# Patient Record
Sex: Male | Born: 1955 | Race: White | Hispanic: No | Marital: Married | Smoking: Former smoker
Health system: Southern US, Community
[De-identification: ages and names within clinical notes are randomized; demographics above are authoritative.]

## PROBLEM LIST (undated history)

## (undated) DIAGNOSIS — Z9889 Other specified postprocedural states: Secondary | ICD-10-CM

## (undated) DIAGNOSIS — Z8781 Personal history of (healed) traumatic fracture: Secondary | ICD-10-CM

## (undated) DIAGNOSIS — K219 Gastro-esophageal reflux disease without esophagitis: Secondary | ICD-10-CM

## (undated) DIAGNOSIS — G4730 Sleep apnea, unspecified: Secondary | ICD-10-CM

## (undated) DIAGNOSIS — N400 Enlarged prostate without lower urinary tract symptoms: Secondary | ICD-10-CM

## (undated) DIAGNOSIS — I8390 Asymptomatic varicose veins of unspecified lower extremity: Secondary | ICD-10-CM

## (undated) DIAGNOSIS — F329 Major depressive disorder, single episode, unspecified: Secondary | ICD-10-CM

## (undated) DIAGNOSIS — L84 Corns and callosities: Secondary | ICD-10-CM

## (undated) DIAGNOSIS — F32A Depression, unspecified: Secondary | ICD-10-CM

## (undated) DIAGNOSIS — F419 Anxiety disorder, unspecified: Secondary | ICD-10-CM

## (undated) DIAGNOSIS — M26629 Arthralgia of temporomandibular joint, unspecified side: Secondary | ICD-10-CM

## (undated) DIAGNOSIS — R112 Nausea with vomiting, unspecified: Secondary | ICD-10-CM

## (undated) DIAGNOSIS — I480 Paroxysmal atrial fibrillation: Secondary | ICD-10-CM

## (undated) DIAGNOSIS — E7800 Pure hypercholesterolemia, unspecified: Secondary | ICD-10-CM

## (undated) DIAGNOSIS — M1990 Unspecified osteoarthritis, unspecified site: Secondary | ICD-10-CM

## (undated) DIAGNOSIS — I441 Atrioventricular block, second degree: Secondary | ICD-10-CM

## (undated) HISTORY — DX: Pure hypercholesterolemia, unspecified: E78.00

## (undated) HISTORY — DX: Corns and callosities: L84

## (undated) HISTORY — PX: TEMPOROMANDIBULAR JOINT SURGERY: SHX35

## (undated) HISTORY — DX: Atrioventricular block, second degree: I44.1

## (undated) HISTORY — DX: Gastro-esophageal reflux disease without esophagitis: K21.9

## (undated) HISTORY — DX: Personal history of (healed) traumatic fracture: Z87.81

## (undated) HISTORY — DX: Paroxysmal atrial fibrillation: I48.0

## (undated) HISTORY — PX: BUNIONECTOMY: SHX129

## (undated) HISTORY — DX: Benign prostatic hyperplasia without lower urinary tract symptoms: N40.0

## (undated) HISTORY — DX: Arthralgia of temporomandibular joint, unspecified side: M26.629

## (undated) HISTORY — DX: Anxiety disorder, unspecified: F41.9

## (undated) HISTORY — DX: Asymptomatic varicose veins of unspecified lower extremity: I83.90

---
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
## 1998-08-11 ENCOUNTER — Ambulatory Visit (HOSPITAL_COMMUNITY): Admission: RE | Admit: 1998-08-11 | Discharge: 1998-08-11 | Payer: Self-pay | Admitting: Internal Medicine

## 1998-08-11 ENCOUNTER — Encounter: Payer: Self-pay | Admitting: Internal Medicine

## 1998-09-01 ENCOUNTER — Ambulatory Visit (HOSPITAL_COMMUNITY): Admission: RE | Admit: 1998-09-01 | Discharge: 1998-09-01 | Payer: Self-pay | Admitting: Internal Medicine

## 1998-09-01 ENCOUNTER — Encounter: Payer: Self-pay | Admitting: Internal Medicine

## 2000-04-26 ENCOUNTER — Ambulatory Visit (HOSPITAL_COMMUNITY): Admission: RE | Admit: 2000-04-26 | Discharge: 2000-04-26 | Payer: Self-pay | Admitting: *Deleted

## 2003-06-28 ENCOUNTER — Encounter: Payer: Self-pay | Admitting: Urology

## 2003-06-28 ENCOUNTER — Ambulatory Visit (HOSPITAL_COMMUNITY): Admission: RE | Admit: 2003-06-28 | Discharge: 2003-06-28 | Payer: Self-pay | Admitting: Urology

## 2019-04-18 DIAGNOSIS — M25561 Pain in right knee: Secondary | ICD-10-CM | POA: Diagnosis not present

## 2019-04-18 DIAGNOSIS — M1711 Unilateral primary osteoarthritis, right knee: Secondary | ICD-10-CM | POA: Diagnosis not present

## 2019-04-18 DIAGNOSIS — G8929 Other chronic pain: Secondary | ICD-10-CM | POA: Diagnosis not present

## 2019-04-23 DIAGNOSIS — Z01818 Encounter for other preprocedural examination: Secondary | ICD-10-CM | POA: Diagnosis not present

## 2019-04-23 DIAGNOSIS — G4733 Obstructive sleep apnea (adult) (pediatric): Secondary | ICD-10-CM | POA: Diagnosis not present

## 2019-04-23 DIAGNOSIS — Z01811 Encounter for preprocedural respiratory examination: Secondary | ICD-10-CM | POA: Diagnosis not present

## 2019-05-08 ENCOUNTER — Other Ambulatory Visit: Payer: Self-pay | Admitting: Orthopedic Surgery

## 2019-06-12 NOTE — Patient Instructions (Addendum)
YOU HAVE HAD A COVID 19 TEST.  PLEASE CONTINUE THE QUARANTINE INSTRUCTIONS AS OUTLINED IN YOUR HANDOUT.                Ronnie Caldwell  06/12/2019   Your procedure is scheduled on: 06-17-19    Report to [facility] Main  Entrance    Report to Admitting at 6:55 AM   1 VISITOR IS ALLOWED TO WAIT IN WAITING ROOM  ONLY DAY OF YOUR SURGERY.    Call this number if you have problems the morning of surgery [phone]    Remember: After Midnight. NOTHING BY MOUTH EXCEPT CLEAR LIQUIDS . PLEASE FINISH ENSURE DRINK, PER YOUR SURGEON AT 6:25 AM.   CLEAR LIQUID DIET   Foods Allowed                                                                     Foods Excluded  Coffee and tea, regular and decaf                             liquids that you cannot  Plain Jell-O any favor except red or purple                                           see through such as: Fruit ices (not with fruit pulp)                                     milk, soups, orange juice  Iced Popsicles                                    All solid food Carbonated beverages, regular and diet                                    Cranberry, grape and apple juices Sports drinks like Gatorade Lightly seasoned clear broth or consume(fat free) Sugar, honey syrup  Sample Menu Breakfast                                Lunch                                     Supper Cranberry juice                    Beef broth                            Chicken broth Jell-O  Grape juice                           Apple juice Coffee or tea                        Jell-O                                      Popsicle                                                Coffee or tea                        Coffee or tea  _____________________________________________________________________     Take these medicines the morning of surgery with A SIP OF WATER: Citalopram (Celexa)  BRUSH YOUR TEETH MORNING OF SURGERY AND  RINSE YOUR MOUTH OUT, NO CHEWING GUM CANDY OR MINTS.                               You may not have any metal on your body including hair pins and              piercings     Do not wear jewelry, cologne, lotions, powders or deodorant               Men may shave face and neck.   Do not bring valuables to the hospital. [place].  Contacts, dentures or bridgework may not be worn into surgery.    Special Instructions: N/A              Please read over the following fact sheets you were given: _____________________________________________________________________             [facility] - Preparing for Surgery Before surgery, you can play an important role.  Because skin is not sterile, your skin needs to be as free of germs as possible.  You can reduce the number of germs on your skin by washing with CHG (chlorahexidine gluconate) soap before surgery.  CHG is an antiseptic cleaner which kills germs and bonds with the skin to continue killing germs even after washing. Please DO NOT use if you have an allergy to CHG or antibacterial soaps.  If your skin becomes reddened/irritated stop using the CHG and inform your nurse when you arrive at Short Stay. Do not shave (including legs and underarms) for at least 48 hours prior to the first CHG shower.  You may shave your face/neck. Please follow these instructions carefully:  1.  Shower with CHG Soap the night before surgery and the  morning of Surgery.  2.  If you choose to wash your hair, wash your hair first as usual with your  normal  shampoo.  3.  After you shampoo, rinse your hair and body thoroughly to remove the  shampoo.  4.  Use CHG as you would any other liquid soap.  You can apply chg directly  to the skin and wash                       Gently with a scrungie or clean washcloth.  5.  Apply the CHG Soap to your body ONLY FROM THE NECK DOWN.   Do not use on face/  open                           Wound or open sores. Avoid contact with eyes, ears mouth and genitals (private parts).                       Wash face,  Genitals (private parts) with your normal soap.             6.  Wash thoroughly, paying special attention to the area where your surgery  will be performed.  7.  Thoroughly rinse your body with warm water from the neck down.  8.  DO NOT shower/wash with your normal soap after using and rinsing off  the CHG Soap.                9.  Pat yourself dry with a clean towel.            10.  Wear clean pajamas.            11.  Place clean sheets on your bed the night of your first shower and do not  sleep with pets. Day of Surgery : Do not apply any lotions/deodorants the morning of surgery.  Please wear clean clothes to the hospital/surgery center.  FAILURE TO FOLLOW THESE INSTRUCTIONS MAY RESULT IN THE CANCELLATION OF YOUR SURGERY PATIENT SIGNATURE_________________________________  NURSE SIGNATURE__________________________________  ________________________________________________________________________   Ronnie Caldwell  An incentive spirometer is a tool that can help keep your lungs clear and active. This tool measures how well you are filling your lungs with each breath. Taking long deep breaths may help reverse or decrease the chance of developing breathing (pulmonary) problems (especially infection) following:  A long period of time when you are unable to move or be active. BEFORE THE PROCEDURE   If the spirometer includes an indicator to show your best effort, your nurse or respiratory therapist will set it to a desired goal.  If possible, sit up straight or lean slightly forward. Try not to slouch.  Hold the incentive spirometer in an upright position. INSTRUCTIONS FOR USE  1. Sit on the edge of your bed if possible, or sit up as far as you can in bed or on a chair. 2. Hold the incentive spirometer in an upright  position. 3. Breathe out normally. 4. Place the mouthpiece in your mouth and seal your lips tightly around it. 5. Breathe in slowly and as deeply as possible, raising the piston or the ball toward the top of the column. 6. Hold your breath for 3-5 seconds or for as long as possible. Allow the piston or ball to fall to the bottom of the column. 7. Remove the mouthpiece from your mouth and breathe out normally. 8. Rest for a few seconds and repeat Steps 1 through 7 at least 10 times every 1-2 hours when you are awake. Take your time and take a few normal breaths between deep breaths. 9. The spirometer may include an indicator to  show your best effort. Use the indicator as a goal to work toward during each repetition. 10. After each set of 10 deep breaths, practice coughing to be sure your lungs are clear. If you have an incision (the cut made at the time of surgery), support your incision when coughing by placing a pillow or rolled up towels firmly against it. Once you are able to get out of bed, walk around indoors and cough well. You may stop using the incentive spirometer when instructed by your caregiver.  RISKS AND COMPLICATIONS  Take your time so you do not get dizzy or light-headed.  If you are in pain, you may need to take or ask for pain medication before doing incentive spirometry. It is harder to take a deep breath if you are having pain. AFTER USE  Rest and breathe slowly and easily.  It can be helpful to keep track of a log of your progress. Your caregiver can provide you with a simple table to help with this. If you are using the spirometer at home, follow these instructions: [place] IF:   You are having difficultly using the spirometer.  You have trouble using the spirometer as often as instructed.  Your pain medication is not giving enough relief while using the spirometer.  You develop fever of 100.5 F (38.1 C) or higher. SEEK IMMEDIATE MEDICAL CARE IF:    You cough up bloody sputum that had not been present before.  You develop fever of 102 F (38.9 C) or greater.  You develop worsening pain at or near the incision site. MAKE SURE YOU:   Understand these instructions.  Will watch your condition.  Will get help right away if you are not doing well or get worse. Document Released: 03/13/2007 Document Revised: 01/23/2012 Document Reviewed: 05/14/2007 [facility] Patient Information 2014 [place], [place].   ________________________________________________________________________

## 2019-06-13 ENCOUNTER — Other Ambulatory Visit (HOSPITAL_COMMUNITY)
Admission: RE | Admit: 2019-06-13 | Discharge: 2019-06-13 | Disposition: A | Discharge status: Home / Self Care | Payer: Medicare Other | Source: Ambulatory Visit | Attending: Orthopedic Surgery | Admitting: Orthopedic Surgery

## 2019-06-13 DIAGNOSIS — Z20828 Contact with and (suspected) exposure to other viral communicable diseases: Secondary | ICD-10-CM | POA: Diagnosis present

## 2019-06-13 LAB — SARS CORONAVIRUS 2 (TAT 6-24 HRS): SARS Coronavirus 2: NEGATIVE

## 2019-06-14 ENCOUNTER — Other Ambulatory Visit: Payer: Self-pay

## 2019-06-14 ENCOUNTER — Encounter (HOSPITAL_COMMUNITY): Payer: Self-pay

## 2019-06-14 ENCOUNTER — Encounter (HOSPITAL_COMMUNITY)
Admission: RE | Admit: 2019-06-14 | Discharge: 2019-06-14 | Disposition: A | Discharge status: Home / Self Care | Payer: Medicare Other | Source: Ambulatory Visit | Attending: Orthopedic Surgery | Admitting: Orthopedic Surgery

## 2019-06-14 DIAGNOSIS — Z01812 Encounter for preprocedural laboratory examination: Secondary | ICD-10-CM | POA: Insufficient documentation

## 2019-06-14 DIAGNOSIS — M1711 Unilateral primary osteoarthritis, right knee: Secondary | ICD-10-CM | POA: Insufficient documentation

## 2019-06-14 DIAGNOSIS — Z87891 Personal history of nicotine dependence: Secondary | ICD-10-CM | POA: Diagnosis not present

## 2019-06-14 DIAGNOSIS — G4730 Sleep apnea, unspecified: Secondary | ICD-10-CM | POA: Diagnosis not present

## 2019-06-14 HISTORY — DX: Unspecified osteoarthritis, unspecified site: M19.90

## 2019-06-14 HISTORY — DX: Depression, unspecified: F32.A

## 2019-06-14 HISTORY — DX: Sleep apnea, unspecified: G47.30

## 2019-06-14 LAB — COMPREHENSIVE METABOLIC PANEL
ALT: 30 U/L (ref 0–44)
AST: 34 U/L (ref 15–41)
Albumin: 4 g/dL (ref 3.5–5.0)
Alkaline Phosphatase: 56 U/L (ref 38–126)
Anion gap: 6 (ref 5–15)
BUN: 13 mg/dL (ref 8–23)
CO2: 25 mmol/L (ref 22–32)
Calcium: 9.6 mg/dL (ref 8.9–10.3)
Chloride: 108 mmol/L (ref 98–111)
Creatinine, Ser: 1.05 mg/dL (ref 0.61–1.24)
GFR calc Af Amer: >60 mL/min (ref >60)
GFR calc non Af Amer: >60 mL/min (ref >60)
Glucose, Bld: 99 mg/dL (ref 70–99)
Potassium: 4 mmol/L (ref 3.5–5.1)
Sodium: 139 mmol/L (ref 135–145)
Total Bilirubin: 0.9 mg/dL (ref 0.3–1.2)
Total Protein: 6.9 g/dL (ref 6.5–8.1)

## 2019-06-14 LAB — CBC WITH DIFFERENTIAL/PLATELET
Abs Immature Granulocytes: 0.01 10*3/uL (ref 0.00–0.07)
Basophils Absolute: 0 10*3/uL (ref 0.0–0.1)
Basophils Relative: 1 %
Eosinophils Absolute: 0.1 10*3/uL (ref 0.0–0.5)
Eosinophils Relative: 2 %
HCT: 40.6 % (ref 39.0–52.0)
Hemoglobin: 13.4 g/dL (ref 13.0–17.0)
Immature Granulocytes: 0 %
Lymphocytes Relative: 36 %
Lymphs Abs: 1.7 10*3/uL (ref 0.7–4.0)
MCH: 34.3 pg — ABNORMAL HIGH (ref 26.0–34.0)
MCHC: 33 g/dL (ref 30.0–36.0)
MCV: 103.8 fL — ABNORMAL HIGH (ref 80.0–100.0)
Monocytes Absolute: 0.4 10*3/uL (ref 0.1–1.0)
Monocytes Relative: 8 %
Neutro Abs: 2.4 10*3/uL (ref 1.7–7.7)
Neutrophils Relative %: 53 %
Platelets: 339 10*3/uL (ref 150–400)
RBC: 3.91 MIL/uL — ABNORMAL LOW (ref 4.22–5.81)
RDW: 12.2 % (ref 11.5–15.5)
WBC: 4.6 10*3/uL (ref 4.0–10.5)
nRBC: 0 % (ref 0.0–0.2)

## 2019-06-14 LAB — SURGICAL PCR SCREEN
MRSA, PCR: NEGATIVE
Staphylococcus aureus: NEGATIVE

## 2019-06-16 MED ORDER — BUPIVACAINE LIPOSOME 1.3 % IJ SUSP
20.0000 mL | Freq: Once | INTRAMUSCULAR | Status: DC
  Filled 2019-06-16: qty 20

## 2019-06-17 ENCOUNTER — Encounter (HOSPITAL_COMMUNITY)
Admission: RE | Disposition: A | Discharge status: Home / Self Care | Payer: Self-pay | Source: Ambulatory Visit | Attending: Orthopedic Surgery

## 2019-06-17 ENCOUNTER — Ambulatory Visit (HOSPITAL_COMMUNITY)
Admission: RE | Admit: 2019-06-17 | Discharge: 2019-06-17 | Disposition: A | Discharge status: Home / Self Care | Payer: Medicare Other | Source: Ambulatory Visit | Attending: Orthopedic Surgery | Admitting: Orthopedic Surgery

## 2019-06-17 ENCOUNTER — Encounter (HOSPITAL_COMMUNITY): Payer: Self-pay | Admitting: *Deleted

## 2019-06-17 ENCOUNTER — Ambulatory Visit (HOSPITAL_COMMUNITY): Payer: Medicare Other | Admitting: Anesthesiology

## 2019-06-17 ENCOUNTER — Ambulatory Visit (HOSPITAL_COMMUNITY): Payer: Medicare Other | Admitting: Physician Assistant

## 2019-06-17 DIAGNOSIS — F329 Major depressive disorder, single episode, unspecified: Secondary | ICD-10-CM | POA: Diagnosis not present

## 2019-06-17 DIAGNOSIS — M1711 Unilateral primary osteoarthritis, right knee: Secondary | ICD-10-CM | POA: Diagnosis not present

## 2019-06-17 DIAGNOSIS — G4730 Sleep apnea, unspecified: Secondary | ICD-10-CM | POA: Diagnosis not present

## 2019-06-17 DIAGNOSIS — Z79899 Other long term (current) drug therapy: Secondary | ICD-10-CM | POA: Diagnosis not present

## 2019-06-17 DIAGNOSIS — Z87891 Personal history of nicotine dependence: Secondary | ICD-10-CM | POA: Diagnosis not present

## 2019-06-17 DIAGNOSIS — G8918 Other acute postprocedural pain: Secondary | ICD-10-CM | POA: Diagnosis not present

## 2019-06-17 DIAGNOSIS — Z96651 Presence of right artificial knee joint: Secondary | ICD-10-CM | POA: Diagnosis not present

## 2019-06-17 HISTORY — PX: TOTAL KNEE ARTHROPLASTY: SHX125

## 2019-06-17 HISTORY — DX: Other specified postprocedural states: Z98.890

## 2019-06-17 HISTORY — DX: Nausea with vomiting, unspecified: R11.2

## 2019-06-17 SURGERY — ARTHROPLASTY, KNEE, TOTAL
Anesthesia: Spinal | Site: Knee | Laterality: Right

## 2019-06-17 MED ORDER — FENTANYL CITRATE (PF) 100 MCG/2ML IJ SOLN
INTRAMUSCULAR | Status: DC | PRN
  Administered 2019-06-17: 50 ug via INTRAVENOUS
  Administered 2019-06-17: 25 ug via INTRAVENOUS

## 2019-06-17 MED ORDER — CELECOXIB 200 MG PO CAPS
400.0000 mg | ORAL_CAPSULE | Freq: Once | ORAL | Status: AC
  Administered 2019-06-17: 400 mg via ORAL
  Filled 2019-06-17: qty 2

## 2019-06-17 MED ORDER — MIDAZOLAM HCL 2 MG/2ML IJ SOLN
INTRAMUSCULAR | Status: AC
  Filled 2019-06-17: qty 2

## 2019-06-17 MED ORDER — METHOCARBAMOL 500 MG PO TABS: 500.0000 mg | ORAL_TABLET | Freq: Four times a day (QID) | ORAL | 0 refills | Status: DC

## 2019-06-17 MED ORDER — BUPIVACAINE-EPINEPHRINE 0.25% -1:200000 IJ SOLN
INTRAMUSCULAR | Status: DC | PRN
  Administered 2019-06-17: 30 mL

## 2019-06-17 MED ORDER — ONDANSETRON HCL 4 MG/2ML IJ SOLN
INTRAMUSCULAR | Status: AC
  Filled 2019-06-17: qty 2

## 2019-06-17 MED ORDER — OXYCODONE HCL 5 MG PO TABS
ORAL_TABLET | ORAL | Status: AC
  Filled 2019-06-17: qty 1

## 2019-06-17 MED ORDER — DEXAMETHASONE SODIUM PHOSPHATE 10 MG/ML IJ SOLN
8.0000 mg | Freq: Once | INTRAMUSCULAR | Status: AC
  Administered 2019-06-17: 8 mg via INTRAVENOUS

## 2019-06-17 MED ORDER — OXYCODONE HCL 5 MG PO TABS: 5.0000 mg | ORAL_TABLET | Freq: Four times a day (QID) | ORAL | 0 refills | Status: DC | PRN

## 2019-06-17 MED ORDER — METHOCARBAMOL 500 MG PO TABS: 500.0000 mg | ORAL_TABLET | Freq: Four times a day (QID) | ORAL | Status: DC

## 2019-06-17 MED ORDER — OXYCODONE HCL 5 MG PO TABS
5.0000 mg | ORAL_TABLET | Freq: Four times a day (QID) | ORAL | Status: DC | PRN
  Administered 2019-06-17: 5 mg via ORAL

## 2019-06-17 MED ORDER — ACETAMINOPHEN 500 MG PO TABS
1000.0000 mg | ORAL_TABLET | Freq: Once | ORAL | Status: AC
  Administered 2019-06-17: 1000 mg via ORAL
  Filled 2019-06-17: qty 2

## 2019-06-17 MED ORDER — EPHEDRINE SULFATE 50 MG/ML IJ SOLN
INTRAMUSCULAR | Status: DC | PRN
  Administered 2019-06-17: 5 mg via INTRAVENOUS
  Administered 2019-06-17: 10 mg via INTRAVENOUS
  Administered 2019-06-17: 5 mg via INTRAVENOUS

## 2019-06-17 MED ORDER — SODIUM CHLORIDE 0.9% FLUSH
INTRAVENOUS | Status: DC | PRN
  Administered 2019-06-17: 20 mL

## 2019-06-17 MED ORDER — MIDAZOLAM HCL 5 MG/5ML IJ SOLN
INTRAMUSCULAR | Status: DC | PRN
  Administered 2019-06-17: 1 mg via INTRAVENOUS

## 2019-06-17 MED ORDER — SODIUM CHLORIDE (PF) 0.9 % IJ SOLN
INTRAMUSCULAR | Status: AC
  Filled 2019-06-17: qty 50

## 2019-06-17 MED ORDER — LIDOCAINE HCL (CARDIAC) PF 100 MG/5ML IV SOSY
PREFILLED_SYRINGE | INTRAVENOUS | Status: DC | PRN
  Administered 2019-06-17: 30 mg via INTRAVENOUS

## 2019-06-17 MED ORDER — ASPIRIN EC 325 MG PO TBEC: 325.0000 mg | DELAYED_RELEASE_TABLET | Freq: Two times a day (BID) | ORAL | 0 refills | Status: AC

## 2019-06-17 MED ORDER — FENTANYL CITRATE (PF) 100 MCG/2ML IJ SOLN: 25.0000 ug | INTRAMUSCULAR | Status: DC | PRN

## 2019-06-17 MED ORDER — GLYCOPYRROLATE 0.2 MG/ML IJ SOLN
INTRAMUSCULAR | Status: DC | PRN
  Administered 2019-06-17: 0.2 mg via INTRAVENOUS

## 2019-06-17 MED ORDER — METHOCARBAMOL 500 MG PO TABS
500.0000 mg | ORAL_TABLET | Freq: Four times a day (QID) | ORAL | Status: DC
  Administered 2019-06-17: 500 mg via ORAL

## 2019-06-17 MED ORDER — FENTANYL CITRATE (PF) 100 MCG/2ML IJ SOLN
50.0000 ug | Freq: Once | INTRAMUSCULAR | Status: AC
  Administered 2019-06-17: 50 ug via INTRAVENOUS
  Filled 2019-06-17: qty 2

## 2019-06-17 MED ORDER — METHOCARBAMOL 500 MG PO TABS
ORAL_TABLET | ORAL | Status: AC
  Filled 2019-06-17: qty 1

## 2019-06-17 MED ORDER — GABAPENTIN 300 MG PO CAPS
300.0000 mg | ORAL_CAPSULE | Freq: Once | ORAL | Status: AC
  Administered 2019-06-17: 08:00:00 300 mg via ORAL
  Filled 2019-06-17: qty 1

## 2019-06-17 MED ORDER — ROPIVACAINE HCL 7.5 MG/ML IJ SOLN
INTRAMUSCULAR | Status: DC | PRN
  Administered 2019-06-17: 20 mL via PERINEURAL

## 2019-06-17 MED ORDER — FENTANYL CITRATE (PF) 100 MCG/2ML IJ SOLN
INTRAMUSCULAR | Status: AC
  Filled 2019-06-17: qty 2

## 2019-06-17 MED ORDER — STERILE WATER FOR IRRIGATION IR SOLN
Status: DC | PRN
  Administered 2019-06-17: 2000 mL

## 2019-06-17 MED ORDER — SODIUM CHLORIDE 0.9 % IR SOLN
Status: DC | PRN
  Administered 2019-06-17: 1000 mL

## 2019-06-17 MED ORDER — GLYCOPYRROLATE PF 0.2 MG/ML IJ SOSY
PREFILLED_SYRINGE | INTRAMUSCULAR | Status: AC
  Filled 2019-06-17: qty 1

## 2019-06-17 MED ORDER — PROPOFOL 10 MG/ML IV BOLUS
INTRAVENOUS | Status: DC | PRN
  Administered 2019-06-17: 10 mg via INTRAVENOUS

## 2019-06-17 MED ORDER — LACTATED RINGERS IV SOLN
INTRAVENOUS | Status: DC
  Administered 2019-06-17 (×2): via INTRAVENOUS

## 2019-06-17 MED ORDER — BUPIVACAINE LIPOSOME 1.3 % IJ SUSP
INTRAMUSCULAR | Status: DC | PRN
  Administered 2019-06-17: 20 mL

## 2019-06-17 MED ORDER — ONDANSETRON HCL 4 MG/2ML IJ SOLN
4.0000 mg | Freq: Once | INTRAMUSCULAR | Status: AC | PRN
  Administered 2019-06-17: 4 mg via INTRAVENOUS

## 2019-06-17 MED ORDER — DEXAMETHASONE SODIUM PHOSPHATE 10 MG/ML IJ SOLN
INTRAMUSCULAR | Status: AC
  Filled 2019-06-17: qty 1

## 2019-06-17 MED ORDER — BUPIVACAINE-EPINEPHRINE (PF) 0.25% -1:200000 IJ SOLN
INTRAMUSCULAR | Status: AC
  Filled 2019-06-17: qty 30

## 2019-06-17 MED ORDER — TRANEXAMIC ACID-NACL 1000-0.7 MG/100ML-% IV SOLN
1000.0000 mg | INTRAVENOUS | Status: AC
  Administered 2019-06-17: 1000 mg via INTRAVENOUS
  Filled 2019-06-17: qty 100

## 2019-06-17 MED ORDER — 0.9 % SODIUM CHLORIDE (POUR BTL) OPTIME
TOPICAL | Status: DC | PRN
  Administered 2019-06-17: 10:00:00 1000 mL

## 2019-06-17 MED ORDER — PROPOFOL 500 MG/50ML IV EMUL
INTRAVENOUS | Status: DC | PRN
  Administered 2019-06-17: 50 ug/kg/min via INTRAVENOUS

## 2019-06-17 MED ORDER — PHENYLEPHRINE HCL (PRESSORS) 10 MG/ML IV SOLN
INTRAVENOUS | Status: DC | PRN
  Administered 2019-06-17: 80 ug via INTRAVENOUS

## 2019-06-17 MED ORDER — ONDANSETRON HCL 4 MG/2ML IJ SOLN
INTRAMUSCULAR | Status: DC | PRN
  Administered 2019-06-17: 4 mg via INTRAVENOUS

## 2019-06-17 MED ORDER — POVIDONE-IODINE 10 % EX SWAB: 2.0000 "application " | Freq: Once | CUTANEOUS | Status: DC

## 2019-06-17 MED ORDER — CLONIDINE HCL (ANALGESIA) 100 MCG/ML EP SOLN
EPIDURAL | Status: DC | PRN
  Administered 2019-06-17: 100 ug

## 2019-06-17 MED ORDER — CHLORHEXIDINE GLUCONATE 4 % EX LIQD: 60.0000 mL | Freq: Once | CUTANEOUS | Status: DC

## 2019-06-17 MED ORDER — MIDAZOLAM HCL 2 MG/2ML IJ SOLN
1.0000 mg | Freq: Once | INTRAMUSCULAR | Status: AC
  Administered 2019-06-17: 2 mg via INTRAVENOUS
  Filled 2019-06-17: qty 2

## 2019-06-17 MED ORDER — CEFAZOLIN SODIUM-DEXTROSE 2-4 GM/100ML-% IV SOLN
2.0000 g | INTRAVENOUS | Status: AC
  Administered 2019-06-17: 2 g via INTRAVENOUS
  Filled 2019-06-17: qty 100

## 2019-06-17 MED ORDER — EPHEDRINE 5 MG/ML INJ
INTRAVENOUS | Status: AC
  Filled 2019-06-17: qty 10

## 2019-06-17 MED ORDER — PROPOFOL 10 MG/ML IV BOLUS
INTRAVENOUS | Status: AC
  Filled 2019-06-17: qty 40

## 2019-06-17 MED ORDER — OXYCODONE HCL 5 MG PO TABS: 5.0000 mg | ORAL_TABLET | Freq: Four times a day (QID) | ORAL | Status: DC | PRN

## 2019-06-17 SURGICAL SUPPLY — 63 items
BAG SPEC THK2 15X12 ZIP CLS (MISCELLANEOUS) ×1
BAG ZIPLOCK 12X15 (MISCELLANEOUS) ×3 IMPLANT
BLADE SAGITTAL 13X1.27X60 (BLADE) ×2 IMPLANT
BLADE SAGITTAL 13X1.27X60MM (BLADE) ×1
BLADE SAW SGTL 83.5X18.5 (BLADE) ×3 IMPLANT
BLADE SURG 15 STRL LF DISP TIS (BLADE) ×1 IMPLANT
BLADE SURG 15 STRL SS (BLADE) ×3
BLADE SURG SZ10 CARB STEEL (BLADE) ×6 IMPLANT
BNDG CMPR MED 10X6 ELC LF (GAUZE/BANDAGES/DRESSINGS) ×1
BNDG ELASTIC 6X10 VLCR STRL LF (GAUZE/BANDAGES/DRESSINGS) ×2 IMPLANT
BNDG ELASTIC 6X5.8 VLCR STR LF (GAUZE/BANDAGES/DRESSINGS) ×3 IMPLANT
BOWL SMART MIX CTS (DISPOSABLE) ×3 IMPLANT
BSPLAT TIB 5D G CMNT STM RT (Knees) ×1 IMPLANT
CEMENT BONE SIMPLEX SPEEDSET (Cement) ×6 IMPLANT
CLOSURE WOUND 1/2 X4 (GAUZE/BANDAGES/DRESSINGS) ×2
COVER SURGICAL LIGHT HANDLE (MISCELLANEOUS) ×3 IMPLANT
COVER WAND RF STERILE (DRAPES) IMPLANT
CUFF TOURN SGL QUICK 34 (TOURNIQUET CUFF) ×3
CUFF TRNQT CYL 34X4.125X (TOURNIQUET CUFF) ×1 IMPLANT
DECANTER SPIKE VIAL GLASS SM (MISCELLANEOUS) ×6 IMPLANT
DRAPE INCISE IOBAN 66X45 STRL (DRAPES) ×6 IMPLANT
DRAPE U-SHAPE 47X51 STRL (DRAPES) ×3 IMPLANT
DRILL PIN HEADLESS TROCAR 3X75 (PIN) ×2 IMPLANT
DRSG AQUACEL AG ADV 3.5X10 (GAUZE/BANDAGES/DRESSINGS) ×3 IMPLANT
DURAPREP 26ML APPLICATOR (WOUND CARE) ×6 IMPLANT
ELECT REM PT RETURN 15FT ADLT (MISCELLANEOUS) ×3 IMPLANT
FEMUR  CMT CCR STD SZ11 R KNEE (Knees) ×2 IMPLANT
FEMUR CMT CCR STD SZ11 R KNEE (Knees) ×1 IMPLANT
FEMUR CMTD CCR STD SZ11 R KNEE (Knees) IMPLANT
GLOVE BIOGEL M STRL SZ7.5 (GLOVE) ×3 IMPLANT
GLOVE BIOGEL PI IND STRL 7.5 (GLOVE) ×1 IMPLANT
GLOVE BIOGEL PI IND STRL 8.5 (GLOVE) ×2 IMPLANT
GLOVE BIOGEL PI INDICATOR 7.5 (GLOVE) ×2
GLOVE BIOGEL PI INDICATOR 8.5 (GLOVE) ×4
GLOVE SURG ORTHO 8.0 STRL STRW (GLOVE) ×9 IMPLANT
GOWN STRL REUS W/ TWL XL LVL3 (GOWN DISPOSABLE) ×2 IMPLANT
GOWN STRL REUS W/TWL XL LVL3 (GOWN DISPOSABLE) ×6
HANDPIECE INTERPULSE COAX TIP (DISPOSABLE) ×3
HOLDER FOLEY CATH W/STRAP (MISCELLANEOUS) ×3 IMPLANT
HOOD PEEL AWAY FLYTE STAYCOOL (MISCELLANEOUS) ×9 IMPLANT
INSERT TIB PERSONA SZ12 RT (Insert) ×2 IMPLANT
KIT TURNOVER KIT A (KITS) IMPLANT
MANIFOLD NEPTUNE II (INSTRUMENTS) ×3 IMPLANT
NEEDLE HYPO 22GX1.5 SAFETY (NEEDLE) ×3 IMPLANT
NS IRRIG 1000ML POUR BTL (IV SOLUTION) ×3 IMPLANT
PACK TOTAL KNEE CUSTOM (KITS) ×3 IMPLANT
PROTECTOR NERVE ULNAR (MISCELLANEOUS) ×3 IMPLANT
SET HNDPC FAN SPRY TIP SCT (DISPOSABLE) ×1 IMPLANT
STEM POLY PAT PLY 38M KNEE (Knees) ×2 IMPLANT
STEM TIBIA 5 DEG SZ G R KNEE (Knees) IMPLANT
STRIP CLOSURE SKIN 1/2X4 (GAUZE/BANDAGES/DRESSINGS) ×3 IMPLANT
SUT BONE WAX W31G (SUTURE) ×3 IMPLANT
SUT MNCRL AB 3-0 PS2 18 (SUTURE) ×3 IMPLANT
SUT STRATAFIX 0 PDS 27 VIOLET (SUTURE) ×3
SUT STRATAFIX PDS+ 0 24IN (SUTURE) ×3 IMPLANT
SUT VIC AB 1 CT1 36 (SUTURE) ×3 IMPLANT
SUTURE STRATFX 0 PDS 27 VIOLET (SUTURE) ×1 IMPLANT
SYR CONTROL 10ML LL (SYRINGE) ×6 IMPLANT
TIBIA STEM 5 DEG SZ G R KNEE (Knees) ×3 IMPLANT
TRAY FOLEY MTR SLVR 16FR STAT (SET/KITS/TRAYS/PACK) ×3 IMPLANT
WATER STERILE IRR 1000ML POUR (IV SOLUTION) ×6 IMPLANT
WRAP KNEE MAXI GEL POST OP (GAUZE/BANDAGES/DRESSINGS) ×3 IMPLANT
YANKAUER SUCT BULB TIP 10FT TU (MISCELLANEOUS) ×3 IMPLANT

## 2019-06-17 NOTE — Anesthesia Procedure Notes (Signed)
Anesthesia Regional Block: Adductor canal block   Pre-Anesthetic Checklist: ,, timeout performed, Correct Patient, Correct Site, Correct Laterality, Correct Procedure, Correct Position, site marked, Risks and benefits discussed,  Surgical consent,  Pre-op evaluation,  At surgeon's request and post-op pain management  Laterality: Right  Prep: chloraprep       Needles:  Injection technique: Single-shot  Needle Type: Echogenic Needle     Needle Length: 9cm  Needle Gauge: 21     Additional Needles:   Procedures:,,,, ultrasound used (permanent image in chart),,,,  Narrative:  Start time: 06/17/2019 8:00 AM End time: 06/17/2019 8:09 AM Injection made incrementally with aspirations every 5 mL.  Performed by: Personally  Anesthesiologist: Catalina Gravel, MD  Additional Notes: No pain on injection. No increased resistance to injection. Injection made in 5cc increments.  Good needle visualization.  Patient tolerated procedure well.

## 2019-06-17 NOTE — H&P (Signed)
Ronnie Caldwell MRN:  [id number] DOB/SEX:  1956/06/07/male  CHIEF COMPLAINT:  Painful right Knee  HISTORY: Patient is a 63 y.o. male presented with a history of pain in the right knee. Onset of symptoms was gradual starting a few years ago with gradually worsening course since that time. Patient has been treated conservatively with over-the-counter NSAIDs and activity modification. Patient currently rates pain in the knee at 10 out of 10 with activity. There is pain at night.  PAST MEDICAL HISTORY: There are no active problems to display for this patient.  Past Medical History:  Diagnosis Date  . Arthritis   . Depression   . Sleep apnea    Past Surgical History:  Procedure Laterality Date  . BUNIONECTOMY    . TEMPOROMANDIBULAR JOINT SURGERY       MEDICATIONS:   No medications prior to admission.    ALLERGIES:  No Known Allergies  REVIEW OF SYSTEMS:  A comprehensive review of systems was negative except for: Musculoskeletal: positive for arthralgias and bone pain   FAMILY HISTORY:  No family history on file.  SOCIAL HISTORY:   Social History   Tobacco Use  . Smoking status: Former Smoker    Packs/day: 0.25    Years: 20.00    Pack years: 5.00    Types: Cigarettes    Quit date: 2010    Years since quitting: 10.5  . Smokeless tobacco: Never Used  . Tobacco comment: Social  Substance Use Topics  . Alcohol use: Yes    Alcohol/week: 2.0 standard drinks    Types: 2 Shots of liquor per week    Comment: daily     EXAMINATION:  Vital signs in last 24 hours:    There were no vitals taken for this visit.  General Appearance:    Alert, cooperative, no distress, appears stated age  Head:    Normocephalic, without obvious abnormality, atraumatic  Eyes:    PERRL, conjunctiva/corneas clear, EOM's intact, fundi    benign, both eyes       Ears:    Normal TM's and external ear canals, both ears  Nose:   Nares normal, septum midline, mucosa normal, no drainage    or sinus  tenderness  Throat:   Lips, mucosa, and tongue normal; teeth and gums normal  Neck:   Supple, symmetrical, trachea midline, no adenopathy;       thyroid:  No enlargement/tenderness/nodules; no carotid   bruit or JVD  Back:     Symmetric, no curvature, ROM normal, no CVA tenderness  Lungs:     Clear to auscultation bilaterally, respirations unlabored  Chest wall:    No tenderness or deformity  Heart:    Regular rate and rhythm, S1 and S2 normal, no murmur, rub   or gallop  Abdomen:     Soft, non-tender, bowel sounds active all four quadrants,    no masses, no organomegaly  Genitalia:    Normal male without lesion, discharge or tenderness  Rectal:    Normal tone, normal prostate, no masses or tenderness;   guaiac negative stool  Extremities:   Extremities normal, atraumatic, no cyanosis or edema  Pulses:   2+ and symmetric all extremities  Skin:   Skin color, texture, turgor normal, no rashes or lesions  Lymph nodes:   Cervical, supraclavicular, and axillary nodes normal  Neurologic:   CNII-XII intact. Normal strength, sensation and reflexes      throughout    Musculoskeletal:  ROM 0-120, Ligaments intact,  Imaging Review  Plain radiographs demonstrate severe degenerative joint disease of the right knee. The overall alignment is neutral. The bone quality appears to be good for age and reported activity level.  Assessment/Plan: Primary osteoarthritis, right knee   The patient history, physical examination and imaging studies are consistent with advanced degenerative joint disease of the right knee. The patient has failed conservative treatment.  The clearance notes were reviewed.  After discussion with the patient it was felt that Total Knee Replacement was indicated. The procedure,  risks, and benefits of total knee arthroplasty were presented and reviewed. The risks including but not limited to aseptic loosening, infection, blood clots, vascular injury, stiffness, patella tracking problems  complications among others were discussed. The patient acknowledged the explanation, agreed to proceed with the plan.  Preoperative templating of the joint replacement has been completed, documented, and submitted to the Operating Room personnel in order to optimize intra-operative equipment management.    Patient's anticipated LOS is less than 2 midnights, meeting these requirements: - Younger than 50 - Lives within 1 hour of care - Has a competent adult at home to recover with post-op recover - NO history of  - Chronic pain requiring opiods  - Diabetes  - Coronary Artery Disease  - Heart failure  - Heart attack  - Stroke  - DVT/VTE  - Cardiac arrhythmia  - Respiratory Failure/COPD  - Renal failure  - Anemia  - Advanced Liver disease       Donia Ast 06/17/2019, 6:15 AM

## 2019-06-17 NOTE — Anesthesia Postprocedure Evaluation (Signed)
Anesthesia Post Note  Patient: Ronnie Caldwell  Procedure(s) Performed: TOTAL KNEE ARTHROPLASTY (Right Knee)     Patient location during evaluation: PACU Anesthesia Type: Spinal Level of consciousness: oriented, awake and alert and awake Pain management: pain level controlled Vital Signs Assessment: post-procedure vital signs reviewed and stable Respiratory status: spontaneous breathing, respiratory function stable, patient connected to nasal cannula oxygen and nonlabored ventilation Cardiovascular status: blood pressure returned to baseline and stable Postop Assessment: no headache, no backache, no apparent nausea or vomiting, patient able to bend at knees and spinal receding Anesthetic complications: no    Last Vitals:  Vitals:   06/17/19 1245 06/17/19 1445  BP: 102/71 114/87  Pulse: (!) 52 64  Resp: 14 14  Temp:    SpO2: 98% 97%    Last Pain:  Vitals:   06/17/19 1245  TempSrc:   PainSc: 0-No pain                 Catalina Gravel

## 2019-06-17 NOTE — Anesthesia Procedure Notes (Signed)
Spinal  Patient location during procedure: OR Start time: 06/17/2019 9:42 AM End time: 06/17/2019 9:47 AM Staffing Resident/CRNA: Garrel Ridgel, CRNA Performed: resident/CRNA  Preanesthetic Checklist Completed: patient identified, site marked, surgical consent, pre-op evaluation, timeout performed, IV checked, risks and benefits discussed and monitors and equipment checked Spinal Block Patient position: sitting Prep: Betadine Patient monitoring: heart rate, continuous pulse ox and blood pressure Approach: midline Location: L3-4 Injection technique: single-shot Needle Needle type: Pencan  Needle gauge: 24 G Needle length: 9 cm Needle insertion depth: 5 cm Assessment Sensory level: T10

## 2019-06-17 NOTE — Progress Notes (Signed)
D/C instructions reviewed with patient and spouse prior to transfer of care to this RN. This RN reviewed instructions with spouse and patient again to ensure that there were no questions about after care. Both patient and spouse gave verbal understanding of instructions and knew of the follow up with physician.

## 2019-06-17 NOTE — Transfer of Care (Signed)
Immediate Anesthesia Transfer of Care Note  Patient: Ronnie Caldwell  Procedure(s) Performed: TOTAL KNEE ARTHROPLASTY (Right Knee)  Patient Location: PACU  Anesthesia Type:Spinal  Level of Consciousness: awake, alert , oriented and patient cooperative  Airway & Oxygen Therapy: Patient Spontanous Breathing and Patient connected to face mask oxygen  Post-op Assessment: Report given to RN and Post -op Vital signs reviewed and stable  Post vital signs: Reviewed and stable  Last Vitals:  Vitals Value Taken Time  BP 103/71 06/17/19 1117  Temp    Pulse 45 06/17/19 1119  Resp    SpO2 100 % 06/17/19 1119  Vitals shown include unvalidated device data.  Last Pain:  Vitals:   06/17/19 0750  TempSrc: Oral         Complications: No apparent anesthesia complications

## 2019-06-17 NOTE — Anesthesia Preprocedure Evaluation (Addendum)
Anesthesia Evaluation  Patient identified by MRN, date of birth, ID band Patient awake    Reviewed: Allergy & Precautions, NPO status , Patient's Chart, lab work & pertinent test results  History of Anesthesia Complications (+) PONV and history of anesthetic complications  Airway Mallampati: II  TM Distance: >3 FB Neck ROM: Full    Dental  (+) Teeth Intact, Dental Advisory Given   Pulmonary sleep apnea , former smoker,    Pulmonary exam normal breath sounds clear to auscultation       Cardiovascular (-) hypertension(-) angina(-) CAD and (-) Past MI negative cardio ROS Normal cardiovascular exam Rhythm:Regular Rate:Normal     Neuro/Psych PSYCHIATRIC DISORDERS Depression negative neurological ROS     GI/Hepatic negative GI ROS, Neg liver ROS,   Endo/Other  negative endocrine ROS  Renal/GU negative Renal ROS     Musculoskeletal  (+) Arthritis  (Primary Osteoarthritis Right Knee), Osteoarthritis,    Abdominal   Peds  Hematology   Anesthesia Other Findings Day of surgery medications reviewed with the patient.  Reproductive/Obstetrics                            Anesthesia Physical Anesthesia Plan  ASA: II  Anesthesia Plan: Spinal   Post-op Pain Management:    Induction: Intravenous  PONV Risk Score and Plan: 2 and Propofol infusion and Treatment may vary due to age or medical condition  Airway Management Planned: Natural Airway and Nasal Cannula  Additional Equipment:   Intra-op Plan:   Post-operative Plan:   Informed Consent: I have reviewed the patients History and Physical, chart, labs and discussed the procedure including the risks, benefits and alternatives for the proposed anesthesia with the patient or authorized representative who has indicated his/her understanding and acceptance.     Dental advisory given  Plan Discussed with: CRNA, Anesthesiologist and  Surgeon  Anesthesia Plan Comments:         Anesthesia Quick Evaluation

## 2019-06-17 NOTE — Progress Notes (Signed)
Phase II Pacu Paged physical therapy at 1345,

## 2019-06-17 NOTE — Evaluation (Signed)
Physical Therapy Treatment Patient Details Name: Ronnie Caldwell MRN: [id number] DOB: 07-17-1956 Today's Date: 06/17/2019    History of Present Illness s/p R TKA    PT Comments    Patient evaluated by Physical Therapy with no further acute PT needs identified. All education has been completed and the patient has no further questions.    See below for any follow-up Physical Therapy or equipment needs. PT is signing off. Thank you for this referral.    Follow Up Recommendations  Follow surgeon's recommendation for DC plan and follow-up therapies     Equipment Recommendations  None recommended by PT    Recommendations for Other Services       Precautions / Restrictions Precautions Precautions: Fall;Knee    Mobility  Bed Mobility Overal bed mobility: Needs Assistance Bed Mobility: Supine to Sit;Sit to Supine     Supine to sit: Supervision Sit to supine: Supervision   General bed mobility comments: for safety  Transfers Overall transfer level: Needs assistance Equipment used: Rolling walker (2 wheeled) Transfers: Sit to/from Stand Sit to Stand: Min guard         General transfer comment: cues for hand  placement  Ambulation/Gait Ambulation/Gait assistance: Min assist;Min guard Gait Distance (Feet): 65 Feet Assistive device: Rolling walker (2 wheeled) Gait Pattern/deviations: Step-to pattern;Decreased weight shift to right     General Gait Details: cues for sequence and  Rw position   Stairs             Wheelchair Mobility    Modified Rankin (Stroke Patients Only)       Balance                                            Cognition Arousal/Alertness: Awake/alert Behavior During Therapy: WFL for tasks assessed/performed Overall Cognitive Status: Within Functional Limits for tasks assessed                                        Exercises Total Joint Exercises Ankle Circles/Pumps: AROM;Both;10 reps Quad  Sets: Both;AROM;5 reps Heel Slides: AROM;5 reps;Right    General Comments        Pertinent Vitals/Pain Pain Assessment: 0-10 Pain Score: 4  Pain Location: right knee Pain Descriptors / Indicators: Aching;Sore Pain Intervention(s): Limited activity within patient's tolerance;Monitored during session;Premedicated before session;Repositioned    Home Living Family/patient expects to be discharged to:: Private residence Living Arrangements: Spouse/significant other Available Help at Discharge: Family Type of Home: House Home Access: Stairs to enter   Home Layout: One level Home Equipment: Environmental consultant - 2 wheels      Prior Function Level of Independence: Independent          PT Goals (current goals can now be found in the care plan section) Acute Rehab PT Goals PT Goal Formulation: All assessment and education complete, DC therapy    Frequency           PT Plan      Co-evaluation              AM-PAC PT "6 Clicks" Mobility   Outcome Measure  Help needed turning from your back to your side while in a flat bed without using bedrails?: None Help needed moving from lying on your back to sitting on the side  of a flat bed without using bedrails?: None Help needed moving to and from a bed to a chair (including a wheelchair)?: None Help needed standing up from a chair using your arms (e.g., wheelchair or bedside chair)?: A Little Help needed to walk in hospital room?: A Little Help needed climbing 3-5 steps with a railing? : A Little 6 Click Score: 21    End of Session Equipment Utilized During Treatment: Gait belt Activity Tolerance: Patient tolerated treatment well Patient left: with call bell/phone within reach;in bed Nurse Communication: Mobility status PT Visit Diagnosis: Difficulty in walking, not elsewhere classified (R26.2)     Time: 1364-3837 PT Time Calculation (min) (ACUTE ONLY): 25 min  Charges:  $Gait Training: 8-22 mins                     Kenyon Ana, PT  Pager: [phone] Acute Rehab Dept [facility]): [phone]   06/17/2019    [facility] 06/17/2019, 4:35 PM

## 2019-06-17 NOTE — Op Note (Signed)
TOTAL KNEE REPLACEMENT OPERATIVE NOTE:  06/17/2019  11:24 AM  PATIENT:  Ronnie Caldwell  63 y.o. male  PRE-OPERATIVE DIAGNOSIS:  Primary Osteoarthritis Right Knee  POST-OPERATIVE DIAGNOSIS:  Primary Osteoarthritis Right Knee  PROCEDURE:  Procedure(s): TOTAL KNEE ARTHROPLASTY  SURGEON:  Surgeon(s): Vickey Huger, MD  PHYSICIAN ASSISTANT: Carlyon Shadow, PA-C   ANESTHESIA:   spinal  SPECIMEN: None  COUNTS:  Correct  TOURNIQUET:   Total Tourniquet Time Documented: Thigh (Right) - 43 minutes Total: Thigh (Right) - 43 minutes   DICTATION:  Indication for procedure:    The patient is a 63 y.o. male who has failed conservative treatment for Primary Osteoarthritis Right Knee.  Informed consent was obtained prior to anesthesia. The risks versus benefits of the operation were explain and in a way the patient can, and did, understand.    Description of procedure:     The patient was taken to the operating room and placed under anesthesia.  The patient was positioned in the usual fashion taking care that all body parts were adequately padded and/or protected.  A tourniquet was applied and the leg prepped and draped in the usual sterile fashion.  The extremity was exsanguinated with the esmarch and tourniquet inflated to 350 mmHg.  Pre-operative range of motion was normal.    A midline incision approximately 6-7 inches long was made with a #10 blade.  A new blade was used to make a parapatellar arthrotomy going 2-3 cm into the quadriceps tendon, over the patella, and alongside the medial aspect of the patellar tendon.  A synovectomy was then performed with the #10 blade and forceps. I then elevated the deep MCL off the medial tibial metaphysis subperiosteally around to the semimembranosus attachment.    I everted the patella and used calipers to measure patellar thickness.  I used the reamer to ream down to appropriate thickness to recreate the native thickness.  I then removed excess  bone with the rongeur and sagittal saw.  I used the appropriately sized template and drilled the three lug holes.  I then put the trial in place and measured the thickness with the calipers to ensure recreation of the native thickness.  The trial was then removed and the patella subluxed and the knee brought into flexion.  A homan retractor was place to retract and protect the patella and lateral structures.  A Z-retractor was place medially to protect the medial structures.  The extra-medullary alignment system was used to make cut the tibial articular surface perpendicular to the anamotic axis of the tibia and in 3 degrees of posterior slope.  The cut surface and alignment jig was removed.  I then used the intramedullary alignment guide to make a  valgus cut on the distal femur.  I then marked out the epicondylar axis on the distal femur.   I then used the anterior referencing sizer and measured the femur to be a size 11.  The 4-In-1 cutting block was screwed into place in external rotation matching the posterior condylar angle, making our cuts perpendicular to the epicondylar axis.  Anterior, posterior and chamfer cuts were made with the sagittal saw.  The cutting block and cut pieces were removed.  A lamina spreader was placed in 90 degrees of flexion.  The ACL, PCL, menisci, and posterior condylar osteophytes were removed.  A 12 mm spacer blocked was found to offer good flexion and extension gap balance after minimal in degree releasing.   The scoop retractor was then placed and  the femoral finishing block was pinned in place.  The small sagittal saw was used as well as the lug drill to finish the femur.  The block and cut surfaces were removed and the medullary canal hole filled with autograft bone from the cut pieces.  The tibia was delivered forward in deep flexion and external rotation.  A size G tray was selected and pinned into place centered on the medial 1/3 of the tibial tubercle.  The reamer  and keel was used to prepare the tibia through the tray.    I then trialed with the size 11 femur, size G tibia, a 12 mm insert and the 38 patella.  I had excellent flexion/extension gap balance, excellent patella tracking.  Flexion was full and beyond 120 degrees; extension was zero.  These components were chosen and the staff opened them to me on the back table while the knee was lavaged copiously and the cement mixed.  The soft tissue was infiltrated with 60cc of exparel 1.3% through a 21 gauge needle.  I cemented in the components and removed all excess cement.  The polyethylene tibial component was snapped into place and the knee placed in extension while cement was hardening.  The capsule was infilltrated with a 60cc exparel/marcaine/saline mixture.   Once the cement was hard, the tourniquet was let down.  Hemostasis was obtained.  The arthrotomy was closed using a #1 stratofix running suture.  The deep soft tissues were closed with #0 vicryls and the subcuticular layer closed with #2-0 vicryl.  The skin was reapproximated and closed with 3.0 Monocryl.  The wound was covered with steristrips, aquacel dressing, and a TED stocking.   The patient was then awakened, extubated, and taken to the recovery room in stable condition.  BLOOD LOSS:  536IW COMPLICATIONS:  None.  PLAN OF CARE: Discharge to home after PACU  PATIENT DISPOSITION:  PACU - hemodynamically stable.    Please fax a copy of this op note to my office at [phone] (please only include page 1 and 2 of the Case Information op note)

## 2019-06-17 NOTE — Progress Notes (Signed)
Assisted Dr. Turk with right, ultrasound guided, adductor canal block. Side rails up, monitors on throughout procedure. See vital signs in flow sheet. Tolerated Procedure well.  

## 2019-06-18 ENCOUNTER — Encounter (HOSPITAL_COMMUNITY): Payer: Self-pay | Admitting: Orthopedic Surgery

## 2019-06-19 DIAGNOSIS — G4733 Obstructive sleep apnea (adult) (pediatric): Secondary | ICD-10-CM | POA: Diagnosis not present

## 2019-06-21 DIAGNOSIS — R29898 Other symptoms and signs involving the musculoskeletal system: Secondary | ICD-10-CM | POA: Diagnosis not present

## 2019-06-21 DIAGNOSIS — R262 Difficulty in walking, not elsewhere classified: Secondary | ICD-10-CM | POA: Diagnosis not present

## 2019-06-21 DIAGNOSIS — M25561 Pain in right knee: Secondary | ICD-10-CM | POA: Diagnosis not present

## 2019-06-21 DIAGNOSIS — Z96651 Presence of right artificial knee joint: Secondary | ICD-10-CM | POA: Diagnosis not present

## 2019-06-21 DIAGNOSIS — M25661 Stiffness of right knee, not elsewhere classified: Secondary | ICD-10-CM | POA: Diagnosis not present

## 2019-06-25 DIAGNOSIS — R29898 Other symptoms and signs involving the musculoskeletal system: Secondary | ICD-10-CM | POA: Diagnosis not present

## 2019-06-25 DIAGNOSIS — M25661 Stiffness of right knee, not elsewhere classified: Secondary | ICD-10-CM | POA: Diagnosis not present

## 2019-06-25 DIAGNOSIS — R262 Difficulty in walking, not elsewhere classified: Secondary | ICD-10-CM | POA: Diagnosis not present

## 2019-06-25 DIAGNOSIS — M25561 Pain in right knee: Secondary | ICD-10-CM | POA: Diagnosis not present

## 2019-06-25 DIAGNOSIS — Z96651 Presence of right artificial knee joint: Secondary | ICD-10-CM | POA: Diagnosis not present

## 2019-06-27 DIAGNOSIS — Z96651 Presence of right artificial knee joint: Secondary | ICD-10-CM | POA: Diagnosis not present

## 2019-06-28 DIAGNOSIS — M25661 Stiffness of right knee, not elsewhere classified: Secondary | ICD-10-CM | POA: Diagnosis not present

## 2019-06-28 DIAGNOSIS — M25561 Pain in right knee: Secondary | ICD-10-CM | POA: Diagnosis not present

## 2019-06-28 DIAGNOSIS — R262 Difficulty in walking, not elsewhere classified: Secondary | ICD-10-CM | POA: Diagnosis not present

## 2019-06-28 DIAGNOSIS — Z96651 Presence of right artificial knee joint: Secondary | ICD-10-CM | POA: Diagnosis not present

## 2019-06-28 DIAGNOSIS — R29898 Other symptoms and signs involving the musculoskeletal system: Secondary | ICD-10-CM | POA: Diagnosis not present

## 2019-07-02 DIAGNOSIS — M25561 Pain in right knee: Secondary | ICD-10-CM | POA: Diagnosis not present

## 2019-07-02 DIAGNOSIS — R29898 Other symptoms and signs involving the musculoskeletal system: Secondary | ICD-10-CM | POA: Diagnosis not present

## 2019-07-02 DIAGNOSIS — M25661 Stiffness of right knee, not elsewhere classified: Secondary | ICD-10-CM | POA: Diagnosis not present

## 2019-07-02 DIAGNOSIS — R262 Difficulty in walking, not elsewhere classified: Secondary | ICD-10-CM | POA: Diagnosis not present

## 2019-07-02 DIAGNOSIS — Z96651 Presence of right artificial knee joint: Secondary | ICD-10-CM | POA: Diagnosis not present

## 2019-07-05 DIAGNOSIS — R29898 Other symptoms and signs involving the musculoskeletal system: Secondary | ICD-10-CM | POA: Diagnosis not present

## 2019-07-05 DIAGNOSIS — Z96651 Presence of right artificial knee joint: Secondary | ICD-10-CM | POA: Diagnosis not present

## 2019-07-05 DIAGNOSIS — M25661 Stiffness of right knee, not elsewhere classified: Secondary | ICD-10-CM | POA: Diagnosis not present

## 2019-07-05 DIAGNOSIS — R262 Difficulty in walking, not elsewhere classified: Secondary | ICD-10-CM | POA: Diagnosis not present

## 2019-07-05 DIAGNOSIS — M25561 Pain in right knee: Secondary | ICD-10-CM | POA: Diagnosis not present

## 2019-07-09 DIAGNOSIS — M25661 Stiffness of right knee, not elsewhere classified: Secondary | ICD-10-CM | POA: Diagnosis not present

## 2019-07-09 DIAGNOSIS — R29898 Other symptoms and signs involving the musculoskeletal system: Secondary | ICD-10-CM | POA: Diagnosis not present

## 2019-07-09 DIAGNOSIS — M25561 Pain in right knee: Secondary | ICD-10-CM | POA: Diagnosis not present

## 2019-07-09 DIAGNOSIS — R262 Difficulty in walking, not elsewhere classified: Secondary | ICD-10-CM | POA: Diagnosis not present

## 2019-07-09 DIAGNOSIS — Z96651 Presence of right artificial knee joint: Secondary | ICD-10-CM | POA: Diagnosis not present

## 2019-07-10 DIAGNOSIS — G4733 Obstructive sleep apnea (adult) (pediatric): Secondary | ICD-10-CM | POA: Diagnosis not present

## 2019-07-12 DIAGNOSIS — R29898 Other symptoms and signs involving the musculoskeletal system: Secondary | ICD-10-CM | POA: Diagnosis not present

## 2019-07-12 DIAGNOSIS — Z96651 Presence of right artificial knee joint: Secondary | ICD-10-CM | POA: Diagnosis not present

## 2019-07-12 DIAGNOSIS — M25561 Pain in right knee: Secondary | ICD-10-CM | POA: Diagnosis not present

## 2019-07-12 DIAGNOSIS — M25661 Stiffness of right knee, not elsewhere classified: Secondary | ICD-10-CM | POA: Diagnosis not present

## 2019-07-12 DIAGNOSIS — R262 Difficulty in walking, not elsewhere classified: Secondary | ICD-10-CM | POA: Diagnosis not present

## 2019-07-16 DIAGNOSIS — R29898 Other symptoms and signs involving the musculoskeletal system: Secondary | ICD-10-CM | POA: Diagnosis not present

## 2019-07-16 DIAGNOSIS — Z96651 Presence of right artificial knee joint: Secondary | ICD-10-CM | POA: Diagnosis not present

## 2019-07-16 DIAGNOSIS — M25561 Pain in right knee: Secondary | ICD-10-CM | POA: Diagnosis not present

## 2019-07-16 DIAGNOSIS — R262 Difficulty in walking, not elsewhere classified: Secondary | ICD-10-CM | POA: Diagnosis not present

## 2019-07-16 DIAGNOSIS — M25661 Stiffness of right knee, not elsewhere classified: Secondary | ICD-10-CM | POA: Diagnosis not present

## 2019-07-18 DIAGNOSIS — M25661 Stiffness of right knee, not elsewhere classified: Secondary | ICD-10-CM | POA: Diagnosis not present

## 2019-07-18 DIAGNOSIS — R262 Difficulty in walking, not elsewhere classified: Secondary | ICD-10-CM | POA: Diagnosis not present

## 2019-07-18 DIAGNOSIS — M25561 Pain in right knee: Secondary | ICD-10-CM | POA: Diagnosis not present

## 2019-07-18 DIAGNOSIS — R29898 Other symptoms and signs involving the musculoskeletal system: Secondary | ICD-10-CM | POA: Diagnosis not present

## 2019-07-18 DIAGNOSIS — Z96651 Presence of right artificial knee joint: Secondary | ICD-10-CM | POA: Diagnosis not present

## 2019-07-23 DIAGNOSIS — Z96651 Presence of right artificial knee joint: Secondary | ICD-10-CM | POA: Diagnosis not present

## 2019-07-23 DIAGNOSIS — R262 Difficulty in walking, not elsewhere classified: Secondary | ICD-10-CM | POA: Diagnosis not present

## 2019-07-23 DIAGNOSIS — M25561 Pain in right knee: Secondary | ICD-10-CM | POA: Diagnosis not present

## 2019-07-23 DIAGNOSIS — M25661 Stiffness of right knee, not elsewhere classified: Secondary | ICD-10-CM | POA: Diagnosis not present

## 2019-07-23 DIAGNOSIS — R29898 Other symptoms and signs involving the musculoskeletal system: Secondary | ICD-10-CM | POA: Diagnosis not present

## 2019-07-25 DIAGNOSIS — M25561 Pain in right knee: Secondary | ICD-10-CM | POA: Diagnosis not present

## 2019-07-25 DIAGNOSIS — R29898 Other symptoms and signs involving the musculoskeletal system: Secondary | ICD-10-CM | POA: Diagnosis not present

## 2019-07-25 DIAGNOSIS — R262 Difficulty in walking, not elsewhere classified: Secondary | ICD-10-CM | POA: Diagnosis not present

## 2019-07-25 DIAGNOSIS — Z96651 Presence of right artificial knee joint: Secondary | ICD-10-CM | POA: Diagnosis not present

## 2019-07-25 DIAGNOSIS — M25661 Stiffness of right knee, not elsewhere classified: Secondary | ICD-10-CM | POA: Diagnosis not present

## 2019-07-29 ENCOUNTER — Encounter: Payer: Self-pay | Admitting: Neurology

## 2019-07-29 ENCOUNTER — Ambulatory Visit (INDEPENDENT_AMBULATORY_CARE_PROVIDER_SITE_OTHER): Payer: Medicare Other | Admitting: Neurology

## 2019-07-29 ENCOUNTER — Other Ambulatory Visit: Payer: Self-pay

## 2019-07-29 VITALS — BP 89/61 | HR 68 | Temp 98.2°F | Ht 76.0 in | Wt 212.0 lb

## 2019-07-29 DIAGNOSIS — R0683 Snoring: Secondary | ICD-10-CM | POA: Diagnosis not present

## 2019-07-29 DIAGNOSIS — G4730 Sleep apnea, unspecified: Secondary | ICD-10-CM | POA: Diagnosis not present

## 2019-07-29 DIAGNOSIS — G4710 Hypersomnia, unspecified: Secondary | ICD-10-CM | POA: Diagnosis not present

## 2019-07-29 MED ORDER — ZOLPIDEM TARTRATE ER 6.25 MG PO TBCR: 6.2500 mg | EXTENDED_RELEASE_TABLET | Freq: Every evening | ORAL | 0 refills | Status: DC | PRN

## 2019-07-29 NOTE — Patient Instructions (Signed)

## 2019-07-29 NOTE — Progress Notes (Signed)
SLEEP MEDICINE CLINIC    Provider:  Larey Seat, MD  Primary Care Physician:  [name], [address] [place] [address]     Referring Provider: [name], [place] [place] [address] [place],  [place] [address]          Chief Complaint according to patient   Patient presents with:    . New Patient (Initial Visit)           HISTORY OF PRESENT ILLNESS:  Ronnie Caldwell is a 63 y.o. year old Caucasian male patient seen here as a New Patient referral on 07/29/2019 .  Chief concern according to patient :  pt states that he snores and stops breathing during sleep. HST completed x2. in 2016 it did show. This HST 06/13/2019 showed no issues. he is here to reassess as his wife still states this happens.  His last home sleep test was ordered through Dr. Donzetta Kohut far and performed on 13 June 2019 the duration was 6-1/2 hours, the RDI was 16.9 which indicates snoring and respiratory resistance, the apnea index was just 5/h which still would account for mild apnea.  There were also 11.7 hypopneas and none of the apneas were classified as central apneas.  Minimum pulse frequency was 55 bpm maximum 147.  There is a caveat so that high pulse rate may be related to arousals and leaving the bed that can be a motion artifact as well.  Was his second study the second night showed an RDI of 4.5 hypopneas of 3.4 none of the previously mentioned pulse irregularities this time 43 bpm was a minimum, and 84 bpm the maximum.  No prolonged oxygen desaturations of significance. The difference is between 16 AHI and 5/h , he drank alcohol the first night.    I have the pleasure of seeing Ronnie Caldwell today, a right -handed Caucasian male with a possible sleep disorder. He   has a past medical history of Arthritis, BPH (benign prostatic hyperplasia), Chronic anxiety, Depression, PONV (postoperative nausea and vomiting), and Sleep apnea..     The patient had the first  sleep study HST  in the year 2016  with a result of an AHI ( Apnea Hypopnea index)  of over 10/h. ,   Sleep relevant medical history: Nocturia 1-2 , Sleep walking: none, Tonsillectomy; none , cervical spine surgery/nasal  septum abnormalities.    Family medical /sleep history: No other family member with OSA is known.    Social history:  Patient is working as a Office manager course caddy-retired with Covid. His wife has a blow dry bar and he will help there.   He lives in a household with 2 persons. Family status is married, with 2 adult children,  No grandchildren.  The patient currently is not working.  Pets are present. 1 dog and 2 cats.  Tobacco use_ quit 2010, 1/2 pack per night smoker for 20 years.  ETOH use: moderate - mixed drinks and liquor.  Caffeine intake in form of Coffee( none ) Soda(  4 of 16 ounces and more ) Tea ( soemtimes) or energy drinks. Regular exercise in form of walking - he just had knee surgery , bikes.   Hobbies : Golf.      Sleep habits are as follows: The patient's dinner time is between 6 PM, he likes to have 2 drinks at 7-8 Pm-. The patient goes to bed with the TV on  at 12-1 AM and  continues to sleep for 3-4 hours, wakes for 1-2 bathroom breaks, the first time at 2-3 AM. He sleeps often on the couch first, then transfers to his bed.   The preferred sleep position is supine, or on his side, with the support of 1-2 pillows.  Dreams are reportedly frequent/vivid- he remembers most of them, sometimes he has a nightmare, and is kicking- his  wife reports loud snoring and apneas.  She left for another bedroom.  9  AM is the usual rise time. The patient wakes up spontaneously around 8-9 AM, and earlier with an alarm.  TST is estimated only 5.5 hours. He reports not feeling refreshed or restored in AM, with symptoms such as dry mouth, sometimes morning headaches, and residual fatigue.  Naps are taken frequently, lasting from 20- to 40 minutes and are more refreshing than nocturnal  sleep.    Review of Systems: Out of a complete 14 system review, the patient complains of only the following symptoms, and all other reviewed systems are negative.:  Fatigue, sleepiness , snoring, fragmented sleep, Insomnia- delayed sleep latency, arousals.    How likely are you to doze in the following situations: 0 = not likely, 1 = slight chance, 2 = moderate chance, 3 = high chance   Sitting and Reading? Watching Television? Sitting inactive in a public place (theater or meeting)? As a passenger in a car for an hour without a break? Lying down in the afternoon when circumstances permit? Sitting and talking to someone? Sitting quietly after lunch without alcohol? In a car, while stopped for a few minutes in traffic?   Total = 5-10 / 24 points   FSS endorsed at 27/ 63 points.   Social History   Socioeconomic History  . Marital status: Married    Spouse name: Not on file  . Number of children: Not on file  . Years of education: Not on file  . Highest education level: Not on file  Occupational History  . Not on file  Social Needs  . Financial resource strain: Not on file  . Food insecurity    Worry: Not on file    Inability: Not on file  . Transportation needs    Medical: Not on file    Non-medical: Not on file  Tobacco Use  . Smoking status: Former Smoker    Packs/day: 0.25    Years: 20.00    Pack years: 5.00    Types: Cigarettes    Quit date: 2010    Years since quitting: 10.7  . Smokeless tobacco: Never Used  . Tobacco comment: Social  Substance and Sexual Activity  . Alcohol use: Yes    Alcohol/week: 2.0 standard drinks    Types: 2 Shots of liquor per week    Comment: daily  . Drug use: Never  . Sexual activity: Not on file  Lifestyle  . Physical activity    Days per week: Not on file    Minutes per session: Not on file  . Stress: Not on file  Relationships  . Social Herbalist on phone: Not on file    Gets together: Not on file     Attends religious service: Not on file    Active member of club or organization: Not on file    Attends meetings of clubs or organizations: Not on file    Relationship status: Not on file  Other Topics Concern  . Not on file  Social History Narrative  . Not  on file    Family History  Problem Relation Age of Onset  . Stroke Mother   . Stroke Father   . Breast cancer Sister     Past Medical History:  Diagnosis Date  . Arthritis   . BPH (benign prostatic hyperplasia)   . Chronic anxiety   . Depression   . PONV (postoperative nausea and vomiting)    30 years ago   . Sleep apnea     Past Surgical History:  Procedure Laterality Date  . BUNIONECTOMY    . TEMPOROMANDIBULAR JOINT SURGERY    . TOTAL KNEE ARTHROPLASTY Right 06/17/2019   Procedure: TOTAL KNEE ARTHROPLASTY;  Surgeon: Vickey Huger, MD;  Location: [facility];  Service: Orthopedics;  Laterality: Right;     Current Outpatient Medications on File Prior to Visit  Medication Sig Dispense Refill  . b complex vitamins tablet Take 1 tablet by mouth daily.    . citalopram (CELEXA) 20 MG tablet Take 20 mg by mouth every morning.    Marland Kitchen glucosamine-chondroitin 500-400 MG tablet Take 1 tablet by mouth daily.    Marland Kitchen ibuprofen (ADVIL) 800 MG tablet Take 800 mg by mouth 2 (two) times daily as needed.    . milk thistle 175 MG tablet Take 175 mg by mouth daily.    . Multiple Vitamin (MULTIVITAMIN WITH MINERALS) TABS tablet Take 1 tablet by mouth daily.    . traZODone (DESYREL) 50 MG tablet Take 50 mg by mouth at bedtime.     No current facility-administered medications on file prior to visit.     No Known Allergies  Physical exam:  Today's Vitals   07/29/19 0855  BP: (!) 89/61  Pulse: 68  Temp: 98.2 F (36.8 C)  Weight: 212 lb (96.2 kg)  Height: 6\' 4"  (1.93 m)   Body mass index is 25.81 kg/m.   Wt Readings from Last 3 Encounters:  07/29/19 212 lb (96.2 kg)  06/14/19 207 lb (93.9 kg)     Ht Readings from Last 3 Encounters:   07/29/19 6\' 4"  (1.93 m)  06/14/19 6' 4.5" (1.943 m)      General: The patient is awake, alert and appears not in acute distress. The patient is well groomed. Head: Normocephalic, atraumatic. Neck is supple. Mallampati 2,  neck circumference:16.5  inches . Nasal airflow  patent.  Retrognathia is not seen.  Dental status: intact . Cardiovascular:  Regular rate and cardiac rhythm by pulse,  without distended neck veins. Respiratory: Lungs are clear to auscultation.  Skin:  Without evidence of ankle edema, or rash. Trunk: The patient's posture is erect.   Neurologic exam : The patient is awake and alert, oriented to place and time.   Memory subjective described as intact.  Attention span & concentration ability appears normal.  Speech is fluent,  without  dysarthria, dysphonia or aphasia.  Mood and affect are appropriate.   Cranial nerves: no loss of smell or taste reported  Pupils are equal and briskly reactive to light. Funduscopic exam deferred. No evidence of cataract.  Extraocular movements in vertical and horizontal planes were intact and without nystagmus. No Diplopia. Visual fields by finger perimetry are intact. Hearing was intact to soft voice and finger rubbing.    Facial sensation intact to fine touch.  Facial motor strength is symmetric and tongue and uvula move midline.  Neck ROM : rotation, tilt and flexion extension were normal for age and shoulder shrug was symmetrical.    Motor exam:  Symmetric bulk, tone  and ROM.   Normal tone without cog wheeling, symmetric grip strength . Sensory:  Fine touch, pinprick and vibration were tested  and  normal.  Proprioception tested in the upper extremities was normal.  Coordination: Rapid alternating movements in the fingers/hands were of normal speed.  The Finger-to-nose maneuver was intact without evidence of ataxia, dysmetria or tremor. Gait and station: Patient could rise unassisted from a seated position, Deep tendon  reflexes: in the upper and lower extremities are symmetric and intact.      After spending a total time of 35  minutes face to face and additional time for physical and neurologic examination, review of laboratory studies,  personal review of imaging studies, reports and results of other testing and review of referral information / records as far as provided in visit, I have established the following assessments:  1) In order to document a true AHI and RDI  I will invite Ronnie Caldwell to an attended sleep study . His consecutive 2 HST  differed by AHI significantly and he remembered having been out on Friday night, having 1-2 drinks- that may be all there is to it. But a PSG will clarify the AHI .   My Plan is to proceed with: attended PSG   I would like to thank [name], MD , for allowing me to meet with and to take care of this pleasant patient.   In short, Ronnie Caldwell is presenting with different results for 2 HST and in contrast to a 2016 study., I plan to follow up either personally or through our NP within 2-3  month.   CC: I will share my notes with PCP   Electronically signed by: Larey Seat, MD 07/29/2019 9:03 AM  [facility] Board certified by The [employer] of Sleep Medicine and Diplomate of the [employer] of Sleep Medicine. Board certified In Neurology through the [employer], Fellow of the [employer] of Neurology. Medical Director of [facility].

## 2019-07-30 DIAGNOSIS — R262 Difficulty in walking, not elsewhere classified: Secondary | ICD-10-CM | POA: Diagnosis not present

## 2019-07-30 DIAGNOSIS — M25561 Pain in right knee: Secondary | ICD-10-CM | POA: Diagnosis not present

## 2019-07-30 DIAGNOSIS — Z96651 Presence of right artificial knee joint: Secondary | ICD-10-CM | POA: Diagnosis not present

## 2019-07-30 DIAGNOSIS — R29898 Other symptoms and signs involving the musculoskeletal system: Secondary | ICD-10-CM | POA: Diagnosis not present

## 2019-07-30 DIAGNOSIS — M25661 Stiffness of right knee, not elsewhere classified: Secondary | ICD-10-CM | POA: Diagnosis not present

## 2019-08-02 DIAGNOSIS — M25561 Pain in right knee: Secondary | ICD-10-CM | POA: Diagnosis not present

## 2019-08-02 DIAGNOSIS — R262 Difficulty in walking, not elsewhere classified: Secondary | ICD-10-CM | POA: Diagnosis not present

## 2019-08-02 DIAGNOSIS — M25661 Stiffness of right knee, not elsewhere classified: Secondary | ICD-10-CM | POA: Diagnosis not present

## 2019-08-02 DIAGNOSIS — R29898 Other symptoms and signs involving the musculoskeletal system: Secondary | ICD-10-CM | POA: Diagnosis not present

## 2019-08-02 DIAGNOSIS — Z96651 Presence of right artificial knee joint: Secondary | ICD-10-CM | POA: Diagnosis not present

## 2019-08-06 DIAGNOSIS — M25561 Pain in right knee: Secondary | ICD-10-CM | POA: Diagnosis not present

## 2019-08-06 DIAGNOSIS — Z96651 Presence of right artificial knee joint: Secondary | ICD-10-CM | POA: Diagnosis not present

## 2019-08-06 DIAGNOSIS — R262 Difficulty in walking, not elsewhere classified: Secondary | ICD-10-CM | POA: Diagnosis not present

## 2019-08-06 DIAGNOSIS — M25661 Stiffness of right knee, not elsewhere classified: Secondary | ICD-10-CM | POA: Diagnosis not present

## 2019-08-06 DIAGNOSIS — R29898 Other symptoms and signs involving the musculoskeletal system: Secondary | ICD-10-CM | POA: Diagnosis not present

## 2019-08-13 ENCOUNTER — Ambulatory Visit (INDEPENDENT_AMBULATORY_CARE_PROVIDER_SITE_OTHER): Payer: Medicare Other | Admitting: Neurology

## 2019-08-13 DIAGNOSIS — R0683 Snoring: Secondary | ICD-10-CM

## 2019-08-13 DIAGNOSIS — G4710 Hypersomnia, unspecified: Secondary | ICD-10-CM

## 2019-08-15 DIAGNOSIS — Z96651 Presence of right artificial knee joint: Secondary | ICD-10-CM | POA: Diagnosis not present

## 2019-08-15 DIAGNOSIS — R262 Difficulty in walking, not elsewhere classified: Secondary | ICD-10-CM | POA: Diagnosis not present

## 2019-08-15 DIAGNOSIS — M25661 Stiffness of right knee, not elsewhere classified: Secondary | ICD-10-CM | POA: Diagnosis not present

## 2019-08-15 DIAGNOSIS — M25561 Pain in right knee: Secondary | ICD-10-CM | POA: Diagnosis not present

## 2019-08-15 DIAGNOSIS — R29898 Other symptoms and signs involving the musculoskeletal system: Secondary | ICD-10-CM | POA: Diagnosis not present

## 2019-08-20 DIAGNOSIS — M25561 Pain in right knee: Secondary | ICD-10-CM | POA: Diagnosis not present

## 2019-08-20 DIAGNOSIS — M25661 Stiffness of right knee, not elsewhere classified: Secondary | ICD-10-CM | POA: Diagnosis not present

## 2019-08-20 DIAGNOSIS — R262 Difficulty in walking, not elsewhere classified: Secondary | ICD-10-CM | POA: Diagnosis not present

## 2019-08-20 DIAGNOSIS — Z96651 Presence of right artificial knee joint: Secondary | ICD-10-CM | POA: Diagnosis not present

## 2019-08-20 DIAGNOSIS — R29898 Other symptoms and signs involving the musculoskeletal system: Secondary | ICD-10-CM | POA: Diagnosis not present

## 2019-08-26 DIAGNOSIS — G4710 Hypersomnia, unspecified: Secondary | ICD-10-CM | POA: Insufficient documentation

## 2019-08-26 DIAGNOSIS — R0683 Snoring: Secondary | ICD-10-CM | POA: Insufficient documentation

## 2019-08-26 NOTE — Procedures (Signed)
PATIENT'S NAME:  Theodoro, Brecker DOB:      04-Jul-1956      MR#:    [id number]     DATE OF RECORDING: 08/13/2019 REFERRING M.D.:  [name] MD Study Performed:  REM montage , expanded EEG Polysomnogram HISTORY:  This 63 y.o. year old Caucasian male patient was seen on 07/29/2019. Mr. Seitz states that he snores and stops breathing during sleep as witnessed by his spouse. Darle Newberg Ruzich is a right -handed Caucasian male with a medical history of Arthritis, BPH (benign prostatic hyperplasia), Chronic anxiety, Depression, PONV (postoperative nausea and vomiting), and Sleep apnea.  A HST completed x2 in 2016 did show apnea periods. This last HST from 06/13/2019 showed no issues. He is here to re-assess as his wife still states snoring and apnea happen.  His last home sleep test was ordered through Dr. Shelia Media and performed on 13 June 2019 - 6-1/2 hours recording time, the RDI was 16.9/h which indicates snoring and respiratory resistance, the apnea index was just 5/h which still would account for mild apnea.  There were also 11.7 hypopneas and none of the apneas were classified as central apneas.  Minimum pulse frequency was 55 bpm, maximum 147 bpm.  The second study the second night showed an RDI of 4.5/h hypopneas of 3.4 none of the previously mentioned pulse irregularities -this time 43 bpm was a minimum, and 84 bpm the maximum.  No prolonged oxygen desaturations of significance. The difference is between AHI 16.1/h and 5/h, he drank also alcohol the first night.   The patient endorsed the Epworth Sleepiness Scale at 10 points.   The patient's weight 212 pounds with a height of 76 (inches), resulting in a BMI of 25.8 kg/m2. The patient's neck circumference measured 16.5 inches.  CURRENT MEDICATIONS: Celexa, Advil, Desyrel   PROCEDURE:  This is a multichannel digital polysomnogram utilizing the Somnostar 11.2 system.  Electrodes and sensors were applied and monitored per AASM Specifications.   EEG, EOG,  Chin and Limb EMG, were sampled at 200 Hz.  ECG, Snore and Nasal Pressure, Thermal Airflow, Respiratory Effort, CPAP Flow and Pressure, Oximetry was sampled at 50 Hz. Digital video and audio were recorded.      BASELINE STUDY: Lights Out was at 21:53 and Lights On at 04:26.  Total recording time (TRT) was 394 minutes, with a total sleep time (TST) of 230 minutes.   The patient's sleep latency was 107 minutes.  Sleep onset was at 23.42. REM latency was 103 minutes. The sleep efficiency was 58.4 %.     SLEEP ARCHITECTURE: WASO (Wake after sleep onset) was 34.5 minutes.  There were 23.5 minutes in Stage N1, 47.5 minutes Stage N2, 114 minutes Stage N3 and 45 minutes in Stage REM.  The percentage of Stage N1 was 10.2%, Stage N2 was 20.7%, Stage N3 was 49.6% and Stage R (REM sleep) was 19.6%.   RESPIRATORY ANALYSIS:  There were a total of 52 respiratory events:  17 obstructive apneas, 0 central apneas and 35 hypopneas.    The total APNEA/HYPOPNEA INDEX (AHI) was 13.6 /hour.  4 events occurred in REM sleep and 62 events in NREM. The REM AHI was 5.3 /hour, versus a non-REM AHI of 15.6. The patient spent 119 minutes of total sleep time in the supine position and 111 minutes in non-supine. The supine AHI was 26.2 versus a non-supine AHI of 0.0.  OXYGEN SATURATION & C02:  The Wake baseline 02 saturation was 96%, with the lowest being  82%. Time spent below 89% saturation equaled 35 minutes.   PERIODIC LIMB MOVEMENTS:   The patient had a total of 0 Periodic Limb Movements.  The arousals were noted as: 28 were spontaneous, 0 were associated with PLMs, and 26 were associated with respiratory events. Audio and video analysis did not show any abnormal or unusual movements, behaviors, phonations or vocalizations.  No bathroom breaks. Soft Snoring was noted. EKG was in keeping with normal sinus rhythm (NSR).  IMPRESSION:  1. Difficulties initiating sleep, prolonged sleep latency was noted. Sleep efficiency was low.    2. Obstructive Sleep Apnea (OSA) at AHI of 13.6/h and not accentuated by REM sleep. Supine sleep increased the AHI of 26.2/h.  3. Soft Snoring. No sustained hypoxemia. No Nocturia. No PLMs.   RECOMMENDATIONS:  1. Advise to avoid alcohol intake as this may have increased the AHI and RDI in previous HST. 2. Avoiding spine sleep will help to reduce apnea.  3. If the patient is able to implement these changes he may not need CPAP therapy. I would invite him to a full-night, attended, CPAP titration study to optimize therapy if he can not avoid sleeping supine.     I certify that I have reviewed the entire raw data recording prior to the issuance of this report in accordance with the Standards of Accreditation of the American Academy of Sleep Medicine (AASM)      Larey Seat, MD     08-26-2019 Diplomat, American Board of Psychiatry and Neurology  Diplomat, American Board of [place] Director, [place] [facility] [employer]

## 2019-08-27 ENCOUNTER — Telehealth: Payer: Self-pay | Admitting: Neurology

## 2019-08-27 DIAGNOSIS — R29898 Other symptoms and signs involving the musculoskeletal system: Secondary | ICD-10-CM | POA: Diagnosis not present

## 2019-08-27 DIAGNOSIS — Z96651 Presence of right artificial knee joint: Secondary | ICD-10-CM | POA: Diagnosis not present

## 2019-08-27 DIAGNOSIS — M25661 Stiffness of right knee, not elsewhere classified: Secondary | ICD-10-CM | POA: Diagnosis not present

## 2019-08-27 DIAGNOSIS — M25561 Pain in right knee: Secondary | ICD-10-CM | POA: Diagnosis not present

## 2019-08-27 DIAGNOSIS — R262 Difficulty in walking, not elsewhere classified: Secondary | ICD-10-CM | POA: Diagnosis not present

## 2019-08-27 NOTE — Telephone Encounter (Signed)
-----   Message from Larey Seat, MD sent at 08/26/2019  5:23 PM EDT ----- Strongly positional dependent apnea in a patient with previously inconclusive HSTs.   1. Difficulties initiating sleep, prolonged sleep latency was noted. Sleep efficiency was low.   2. Obstructive Sleep Apnea (OSA) at AHI of 13.6/h and not accentuated by REM sleep. Supine sleep increased the AHI of 26.2/h.  3. Soft Snoring. No sustained hypoxemia. No Nocturia. No PLMs.   RECOMMENDATIONS:  1. Advise to avoid alcohol intake as this may have increased the AHI and RDI in previous HST. 2. Avoiding spine sleep will help to reduce apnea.  3. If the patient is able to implement these changes he may not need CPAP therapy.   I would invite him to a full-night, attended, CPAP titration study to optimize therapy if he cannot avoid sleeping supine.

## 2019-08-27 NOTE — Telephone Encounter (Signed)
Called patient to discuss sleep study results. No answer at this time. LVM for the patient to call back.   

## 2019-08-27 NOTE — Telephone Encounter (Signed)
Pt has returned the call to RN Myriam Jacobson, please call

## 2019-08-28 ENCOUNTER — Other Ambulatory Visit: Payer: Self-pay | Admitting: Neurology

## 2019-08-28 DIAGNOSIS — R0683 Snoring: Secondary | ICD-10-CM

## 2019-08-28 DIAGNOSIS — G4730 Sleep apnea, unspecified: Secondary | ICD-10-CM

## 2019-08-28 DIAGNOSIS — G4710 Hypersomnia, unspecified: Secondary | ICD-10-CM

## 2019-08-28 NOTE — Telephone Encounter (Signed)
Pt returned call I advised pt that Dr. Brett Fairy reviewed their sleep study results and found that pt has mild apnea present. Dr. Brett Fairy recommends that pt change possi. I reviewed PAP compliance expectations with the pt. Pt is agreeable to starting a CPAP. I advised pt that an order will be sent to a DME, [facility], and [facility] will call the pt within about one week after they file with the pt's insurance. [facility] will show the pt how to use the machine, fit for masks, and troubleshoot the CPAP if needed. A follow up appt was made for insurance purposes with Debbora Presto, NP on 10/28/19 at 1 pm. Pt verbalized understanding to arrive 15 minutes early and bring their CPAP. A letter with all of this information in it will be mailed to the pt as a reminder. I verified with the pt that the address we have on file is correct. Pt verbalized understanding of results. Pt had no questions at this time but was encouraged to call back if questions arise. I have sent the order to [facility] and have received confirmation that they have received the order.

## 2019-09-03 DIAGNOSIS — Z96651 Presence of right artificial knee joint: Secondary | ICD-10-CM | POA: Diagnosis not present

## 2019-09-03 DIAGNOSIS — M25661 Stiffness of right knee, not elsewhere classified: Secondary | ICD-10-CM | POA: Diagnosis not present

## 2019-09-03 DIAGNOSIS — R262 Difficulty in walking, not elsewhere classified: Secondary | ICD-10-CM | POA: Diagnosis not present

## 2019-09-03 DIAGNOSIS — R29898 Other symptoms and signs involving the musculoskeletal system: Secondary | ICD-10-CM | POA: Diagnosis not present

## 2019-09-03 DIAGNOSIS — M25561 Pain in right knee: Secondary | ICD-10-CM | POA: Diagnosis not present

## 2019-09-17 DIAGNOSIS — G4733 Obstructive sleep apnea (adult) (pediatric): Secondary | ICD-10-CM | POA: Diagnosis not present

## 2019-09-18 DIAGNOSIS — G4733 Obstructive sleep apnea (adult) (pediatric): Secondary | ICD-10-CM | POA: Diagnosis not present

## 2019-10-17 DIAGNOSIS — G4733 Obstructive sleep apnea (adult) (pediatric): Secondary | ICD-10-CM | POA: Diagnosis not present

## 2019-10-27 NOTE — Patient Instructions (Addendum)
Continue CPAP nightly and greater than 4 hours each night  Please follow up with Dr Shelia Media ASAP for concerns of irregular heart rhythm.   Follow up in 1 year, sooner if needed   Sleep Apnea Sleep apnea affects breathing during sleep. It causes breathing to stop for a short time or to become shallow. It can also increase the risk of:  Heart attack.  Stroke.  Being very overweight (obese).  Diabetes.  Heart failure.  Irregular heartbeat. The goal of treatment is to help you breathe normally again. What are the causes? There are three kinds of sleep apnea:  Obstructive sleep apnea. This is caused by a blocked or collapsed airway.  Central sleep apnea. This happens when the brain does not send the right signals to the muscles that control breathing.  Mixed sleep apnea. This is a combination of obstructive and central sleep apnea. The most common cause of this condition is a collapsed or blocked airway. This can happen if:  Your throat muscles are too relaxed.  Your tongue and tonsils are too large.  You are overweight.  Your airway is too small. What increases the risk?  Being overweight.  Smoking.  Having a small airway.  Being older.  Being male.  Drinking alcohol.  Taking medicines to calm yourself (sedatives or tranquilizers).  Having family members with the condition. What are the signs or symptoms?  Trouble staying asleep.  Being sleepy or tired during the day.  Getting angry a lot.  Loud snoring.  Headaches in the morning.  Not being able to focus your mind (concentrate).  Forgetting things.  Less interest in sex.  Mood swings.  Personality changes.  Feelings of sadness (depression).  Waking up a lot during the night to pee (urinate).  Dry mouth.  Sore throat. How is this diagnosed?  Your medical history.  A physical exam.  A test that is done when you are sleeping (sleep study). The test is most often done in a sleep lab but  may also be done at home. How is this treated?   Sleeping on your side.  Using a medicine to get rid of mucus in your nose (decongestant).  Avoiding the use of alcohol, medicines to help you relax, or certain pain medicines (narcotics).  Losing weight, if needed.  Changing your diet.  Not smoking.  Using a machine to open your airway while you sleep, such as: ? An oral appliance. This is a mouthpiece that shifts your lower jaw forward. ? A CPAP device. This device blows air through a mask when you breathe out (exhale). ? An EPAP device. This has valves that you put in each nostril. ? A BPAP device. This device blows air through a mask when you breathe in (inhale) and breathe out.  Having surgery if other treatments do not work. It is important to get treatment for sleep apnea. Without treatment, it can lead to:  High blood pressure.  Coronary artery disease.  In men, not being able to have an erection (impotence).  Reduced thinking ability. Follow these instructions at home: Lifestyle  Make changes that your doctor recommends.  Eat a healthy diet.  Lose weight if needed.  Avoid alcohol, medicines to help you relax, and some pain medicines.  Do not use any products that contain nicotine or tobacco, such as cigarettes, e-cigarettes, and chewing tobacco. If you need help quitting, ask your doctor. General instructions  Take over-the-counter and prescription medicines only as told by your doctor.  If you were given a machine to use while you sleep, use it only as told by your doctor.  If you are having surgery, make sure to tell your doctor you have sleep apnea. You may need to bring your device with you.  Keep all follow-up visits as told by your doctor. This is important. Contact a doctor if:  The machine that you were given to use during sleep bothers you or does not seem to be working.  You do not get better.  You get worse. Get help right away if:  Your  chest hurts.  You have trouble breathing in enough air.  You have an uncomfortable feeling in your back, arms, or stomach.  You have trouble talking.  One side of your body feels weak.  A part of your face is hanging down. These symptoms may be an emergency. Do not wait to see if the symptoms will go away. Get medical help right away. Call your local emergency services (911 in the [place] Do not drive yourself to the hospital. Summary  This condition affects breathing during sleep.  The most common cause is a collapsed or blocked airway.  The goal of treatment is to help you breathe normally while you sleep. This information is not intended to replace advice given to you by your health care provider. Make sure you discuss any questions you have with your health care provider. Document Released: 08/09/2008 Document Revised: 08/17/2018 Document Reviewed: 06/26/2018 Elsevier Patient Education  2020 [employer].

## 2019-10-27 NOTE — Progress Notes (Signed)
PATIENT: Ronnie Caldwell DOB: 09-15-1956  REASON FOR VISIT: follow up HISTORY FROM: patient  Chief Complaint  Patient presents with  . Follow-up    Room 1, alone. CPAP works well, no concerns.     HISTORY OF PRESENT ILLNESS: Today 10/28/19 Ronnie Caldwell is a 63 y.o. male here today for follow up for OSA on CPAP. Sleep study revealed mild OSA.  He was started on AutoPap about 3 months ago.  He reports he is doing very well with therapy.  He is using a nasal mask and feels that it fits well.  He has noted that he is sleeping better and has more energy throughout the day.  Compliance report dated 09/28/2019 through 10/27/2019 reveals that he has used CPAP 29 out of the last 30 days for compliance of 97%.  28 days he used CPAP greater than 4 hours for compliance of 93%.  Average usage was 5 hours and 43 minutes.  AHI was 3.8 on 5 to 12 cm of water and an EPR of 3.  There was no significant leak noted.   HISTORY: (copied from Dr Edwena Felty note on 07/29/2019)  Ronnie Griesel Justiceis a 63 y.o. year old Caucasian male patientseen here as a New Patient referralon 07/29/2019 .  Chiefconcernaccording to patient : pt states that he snores and stops breathing during sleep. HST completed x2. in 2016 it did show. This HST 06/13/2019 showed no issues. he is here to reassess as his wife still states this happens.  His last home sleep test was ordered through Dr. Donzetta Kohut far and performed on 13 June 2019 the duration was 6-1/2 hours, the RDI was 16.9 which indicates snoring and respiratory resistance, the apnea index was just 5/h which still would account for mild apnea.  There were also 11.7 hypopneas and none of the apneas were classified as central apneas.  Minimum pulse frequency was 55 bpm maximum 147.  There is a caveat so that high pulse rate may be related to arousals and leaving the bed that can be a motion artifact as well.  Was his second study the second night showed an RDI of 4.5 hypopneas  of 3.4 none of the previously mentioned pulse irregularities this time 43 bpm was a minimum, and 84 bpm the maximum.  No prolonged oxygen desaturations of significance. The difference is between 16 AHI and 5/h , he drank alcohol the first night.   I have the pleasure of seeing Ronnie Caldwell today,a right -handed Caucasian male with a possible sleep disorder. He   has a past medical history of Arthritis, BPH (benign prostatic hyperplasia), Chronic anxiety, Depression, PONV (postoperative nausea and vomiting), and Sleep apnea..  The patient had the first sleep study HST  in the year 2016  with a result of an AHI ( Apnea Hypopnea index)  of over 10/h. ,  Sleeprelevant medical history: Nocturia 1-2 , Sleep walking: none, Tonsillectomy; none , cervical spine surgery/nasal  septum abnormalities.   Familymedical /sleep history:No other family member with OSA is known.  Social history:Patient is working as a Office manager course caddy-retired with Covid. His wife has a blow dry bar and he will help there.   He lives in a household with 2 persons. Family status is married, with 2 adult children,  No grandchildren.  The patient currently is not working.  Pets are present. 1 dog and 2 cats.  Tobacco use_ quit 2010, 1/2 pack per night smoker for 20 years. ETOH use: moderate -  mixed drinks and liquor.  Caffeine intake in form of Coffee( none ) Soda(  4 of 16 ounces and more ) Tea ( soemtimes) or energy drinks. Regular exercise in form of walking - he just had knee surgery , bikes.   Hobbies : Golf.  Sleep habits are as follows:The patient's dinner time is between 6 PM, he likes to have 2 drinks at 7-8 Pm-. The patient goes to bed with the TV on  at 12-1 AM and continues to sleep for 3-4 hours, wakes for 1-2 bathroom breaks, the first time at 2-3 AM. He sleeps often on the couch first, then transfers to his bed.   The preferred sleep position is supine, or on his side, with the support of 1-2 pillows.    Dreams are reportedly frequent/vivid- he remembers most of them, sometimes he has a nightmare, and is kicking- his  wife reports loud snoring and apneas.  She left for another bedroom.  9  AM is the usual rise time. The patient wakes up spontaneously around 8-9 AM, and earlier with an alarm.  TST is estimated only 5.5 hours. He reports not feeling refreshed or restored in AM, with symptoms such as dry mouth, sometimes morning headaches, and residual fatigue.  Naps are taken frequently, lasting from 20- to 40 minutes and are more refreshing than nocturnal sleep.   REVIEW OF SYSTEMS: Out of a complete 14 system review of symptoms, the patient complains only of the following symptoms, none and all other reviewed systems are negative.  Epworth sleepiness scale: 7 Fatigue severity scale: 14  ALLERGIES: No Known Allergies  HOME MEDICATIONS: Outpatient Medications Prior to Visit  Medication Sig Dispense Refill  . b complex vitamins tablet Take 1 tablet by mouth daily.    . citalopram (CELEXA) 20 MG tablet Take 20 mg by mouth every morning.    Marland Kitchen ibuprofen (ADVIL) 800 MG tablet Take 800 mg by mouth 2 (two) times daily as needed.    . milk thistle 175 MG tablet Take 175 mg by mouth daily.    . Multiple Vitamin (MULTIVITAMIN WITH MINERALS) TABS tablet Take 1 tablet by mouth daily.    . traZODone (DESYREL) 50 MG tablet Take 50 mg by mouth at bedtime.    Marland Kitchen glucosamine-chondroitin 500-400 MG tablet Take 1 tablet by mouth daily.    Marland Kitchen zolpidem (AMBIEN CR) 6.25 MG CR tablet Take 1 tablet (6.25 mg total) by mouth at bedtime as needed for sleep. 10 tablet 0   No facility-administered medications prior to visit.    PAST MEDICAL HISTORY: Past Medical History:  Diagnosis Date  . Arthritis   . BPH (benign prostatic hyperplasia)   . Chronic anxiety   . Depression   . PONV (postoperative nausea and vomiting)    30 years ago   . Sleep apnea     PAST SURGICAL HISTORY: Past Surgical History:   Procedure Laterality Date  . BUNIONECTOMY    . TEMPOROMANDIBULAR JOINT SURGERY    . TOTAL KNEE ARTHROPLASTY Right 06/17/2019   Procedure: TOTAL KNEE ARTHROPLASTY;  Surgeon: Vickey Huger, MD;  Location: [facility];  Service: Orthopedics;  Laterality: Right;    FAMILY HISTORY: Family History  Problem Relation Age of Onset  . Stroke Mother   . Stroke Father   . Breast cancer Sister     SOCIAL HISTORY: Social History   Socioeconomic History  . Marital status: Married    Spouse name: Not on file  . Number of children: Not on  file  . Years of education: Not on file  . Highest education level: Not on file  Occupational History  . Not on file  Tobacco Use  . Smoking status: Former Smoker    Packs/day: 0.25    Years: 20.00    Pack years: 5.00    Types: Cigarettes    Quit date: 2010    Years since quitting: 10.9  . Smokeless tobacco: Never Used  . Tobacco comment: Social  Substance and Sexual Activity  . Alcohol use: Yes    Alcohol/week: 2.0 standard drinks    Types: 2 Shots of liquor per week    Comment: daily  . Drug use: Never  . Sexual activity: Not on file  Other Topics Concern  . Not on file  Social History Narrative  . Not on file   Social Determinants of Health   Financial Resource Strain:   . Difficulty of Paying Living Expenses: Not on file  Food Insecurity:   . Worried About Charity fundraiser in the Last Year: Not on file  . Ran Out of Food in the Last Year: Not on file  Transportation Needs:   . Lack of Transportation (Medical): Not on file  . Lack of Transportation (Non-Medical): Not on file  Physical Activity:   . Days of Exercise per Week: Not on file  . Minutes of Exercise per Session: Not on file  Stress:   . Feeling of Stress : Not on file  Social Connections:   . Frequency of Communication with Friends and Family: Not on file  . Frequency of Social Gatherings with Friends and Family: Not on file  . Attends Religious Services: Not on file  .  Active Member of Clubs or Organizations: Not on file  . Attends Archivist Meetings: Not on file  . Marital Status: Not on file  Intimate Partner Violence:   . Fear of Current or Ex-Partner: Not on file  . Emotionally Abused: Not on file  . Physically Abused: Not on file  . Sexually Abused: Not on file      PHYSICAL EXAM  Vitals:   10/28/19 1304  BP: 100/60  Pulse: (!) 108  Temp: (!) 97 F (36.1 C)  Weight: 216 lb 9.6 oz (98.2 kg)  Height: 6' 4.5" (1.943 m)   Body mass index is 26.02 kg/m.  Generalized: Well developed, in no acute distress  Cardiology: Irregularly irregular Respiratory: Clear to auscultation bilaterally Neurological examination  Mentation: Alert oriented to time, place, history taking. Follows all commands speech and language fluent Cranial nerve II-XII: Pupils were equal round reactive to light. Extraocular movements were full, visual field were full on confrontational test.  Motor: The motor testing reveals 5 over 5 strength of all 4 extremities. Good symmetric motor tone is noted throughout.  Gait and station: Gait is normal.   DIAGNOSTIC DATA (LABS, IMAGING, TESTING) - I reviewed patient records, labs, notes, testing and imaging myself where available.  No flowsheet data found.   Lab Results  Component Value Date   WBC 4.6 06/14/2019   HGB 13.4 06/14/2019   HCT 40.6 06/14/2019   MCV 103.8 (H) 06/14/2019   PLT 339 06/14/2019      Component Value Date/Time   NA 139 06/14/2019 1157   K 4.0 06/14/2019 1157   CL 108 06/14/2019 1157   CO2 25 06/14/2019 1157   GLUCOSE 99 06/14/2019 1157   BUN 13 06/14/2019 1157   CREATININE 1.05 06/14/2019 1157   CALCIUM  9.6 06/14/2019 1157   PROT 6.9 06/14/2019 1157   ALBUMIN 4.0 06/14/2019 1157   AST 34 06/14/2019 1157   ALT 30 06/14/2019 1157   ALKPHOS 56 06/14/2019 1157   BILITOT 0.9 06/14/2019 1157   GFRNONAA >60 06/14/2019 1157   GFRAA >60 06/14/2019 1157   No results found for: CHOL,  HDL, LDLCALC, LDLDIRECT, TRIG, CHOLHDL No results found for: HGBA1C No results found for: VITAMINB12 No results found for: TSH     ASSESSMENT AND PLAN 63 y.o. year old male  has a past medical history of Arthritis, BPH (benign prostatic hyperplasia), Chronic anxiety, Depression, PONV (postoperative nausea and vomiting), and Sleep apnea. here with     ICD-10-CM   1. OSA on CPAP  G47.33    Z99.89   2. Irregular heartbeat  I49.9     Mr. Heyde is doing very well on CPAP therapy.  Compliance report reveals excellent compliance.  He has noted improvement in sleep quality and increased energy throughout the day.  He was encouraged to continue using CPAP nightly and for greater than 4 hours each night.  Upon exam, he was noted to have an irregular heart rhythm.  Blood pressure is 100/60.  Pulse on Dinamap is 108, however, manual pulse is 65-70.  He has no personal or family history of atrial fibrillation.  He does drink Coca-Cola regularly.  He denies any concerns of dizziness, lightheadedness, confusion, weakness, chest pain or trouble breathing.  In conversation, he does mention that occasionally he will feel dizzy in the mornings upon waking.  He has contributed this to higher carbohydrate intake.  This is not a new finding and has occurred since childhood.  I have discussed these findings in detail with the patient.  I have advised that he call his primary care provider soon as possible to establish an appointment for evaluation as our office is not equipped with a EKG.  Patient is asymptomatic, therefore, I will not recommend ER evaluation at this time.  He was educated regarding red flag warnings and when to seek emergency medical attention.  I have also reached out to Dr. Pennie Banter office and spoke with his nurse, Gay Filler.  She is aware of today's findings and will relate this message to Dr. Shelia Media.  I will also send today's notes over for review.  He will follow-up with me in 1 year, sooner if needed.   He verbalizes understanding and agreement with this plan.   No orders of the defined types were placed in this encounter.    No orders of the defined types were placed in this encounter.     I spent 20 minutes with the patient. 50% of this time was spent counseling and educating patient on plan of care and medications.    Debbora Presto, FNP-C 10/28/2019, 2:19 PM [facility] [address] [place], [place] [address] [phone]

## 2019-10-28 ENCOUNTER — Ambulatory Visit: Payer: Medicare Other | Admitting: Family Medicine

## 2019-10-28 ENCOUNTER — Encounter: Payer: Self-pay | Admitting: Family Medicine

## 2019-10-28 ENCOUNTER — Other Ambulatory Visit: Payer: Self-pay

## 2019-10-28 ENCOUNTER — Telehealth: Payer: Self-pay | Admitting: Family Medicine

## 2019-10-28 VITALS — BP 100/60 | HR 108 | Temp 97.0°F | Ht 76.5 in | Wt 216.6 lb

## 2019-10-28 DIAGNOSIS — I499 Cardiac arrhythmia, unspecified: Secondary | ICD-10-CM

## 2019-10-28 DIAGNOSIS — Z9989 Dependence on other enabling machines and devices: Secondary | ICD-10-CM | POA: Diagnosis not present

## 2019-10-28 DIAGNOSIS — G4733 Obstructive sleep apnea (adult) (pediatric): Secondary | ICD-10-CM

## 2019-10-28 NOTE — Telephone Encounter (Signed)
FYI- Patient does not wish to schedule 1 year follow-up at this time. Patient states he will call.

## 2019-10-29 DIAGNOSIS — R0600 Dyspnea, unspecified: Secondary | ICD-10-CM | POA: Diagnosis not present

## 2019-10-29 DIAGNOSIS — I499 Cardiac arrhythmia, unspecified: Secondary | ICD-10-CM | POA: Diagnosis not present

## 2019-11-17 DIAGNOSIS — G4733 Obstructive sleep apnea (adult) (pediatric): Secondary | ICD-10-CM | POA: Diagnosis not present

## 2019-11-26 DIAGNOSIS — R0600 Dyspnea, unspecified: Secondary | ICD-10-CM | POA: Diagnosis not present

## 2019-12-04 ENCOUNTER — Ambulatory Visit: Payer: Medicare Other | Attending: Internal Medicine

## 2019-12-04 DIAGNOSIS — Z23 Encounter for immunization: Secondary | ICD-10-CM

## 2019-12-04 NOTE — Progress Notes (Signed)
   Covid-19 Vaccination Clinic  Name:  Ronnie Caldwell    MRN: [id number] DOB: 1956/11/06  12/04/2019  [name] was observed post Covid-19 immunization for 15 minutes without incidence. He was provided with Vaccine Information Sheet and instruction to access the V-Safe system.   [name] was instructed to call 911 with any severe reactions post vaccine: Marland Kitchen Difficulty breathing  . Swelling of your face and throat  . A fast heartbeat  . A bad rash all over your body  . Dizziness and weakness    Immunizations Administered    Name Date Dose VIS Date Route   Pfizer COVID-19 Vaccine 12/04/2019 10:41 AM 0.3 mL 10/25/2019 Intramuscular   Manufacturer: [place]   Lot: [id number]   [place]: [id number]

## 2019-12-09 DIAGNOSIS — R0600 Dyspnea, unspecified: Secondary | ICD-10-CM | POA: Diagnosis not present

## 2019-12-11 DIAGNOSIS — I480 Paroxysmal atrial fibrillation: Secondary | ICD-10-CM | POA: Diagnosis not present

## 2019-12-16 ENCOUNTER — Telehealth (INDEPENDENT_AMBULATORY_CARE_PROVIDER_SITE_OTHER): Payer: Medicare Other | Admitting: Internal Medicine

## 2019-12-16 ENCOUNTER — Encounter: Payer: Self-pay | Admitting: Internal Medicine

## 2019-12-16 VITALS — BP 105/84 | HR 60 | Ht >= 80 in | Wt 216.0 lb

## 2019-12-16 DIAGNOSIS — G4733 Obstructive sleep apnea (adult) (pediatric): Secondary | ICD-10-CM

## 2019-12-16 DIAGNOSIS — I480 Paroxysmal atrial fibrillation: Secondary | ICD-10-CM | POA: Diagnosis not present

## 2019-12-16 NOTE — Progress Notes (Signed)
Electrophysiology TeleHealth Note   Due to national recommendations of social distancing due to [place] 19, Audio/video telehealth visit is felt to be most appropriate for this patient at this time.  See MyChart message from today for patient consent regarding telehealth for [facility].   Date:  12/16/2019   ID:  Ronnie Caldwell, DOB 1956/07/07, MRN [id number]  Location: home  Provider location: [place] Evaluation Performed: New patient consult  PCP:  [name], MD  Cardiologist:  No primary care provider on file.  Electrophysiologist:  None   Chief Complaint:  afib  History of Present Illness:    Ronnie Caldwell is a 64 y.o. male who presents via audio/video conferencing for a telehealth visit today.   The patient is referred for new consultation regarding afib by Dr Shelia Media.   He presented for routine follow-up for OSA and was noted to have an irregular pulse on auscultation.  ekg was unrevealing however Zio monitor was placed.  This has documented afib burden of 11% with episodes of RVR.  He was also observed to have mobitz I second degree AV block and nocturnal bradycardia. He is very active.  He is mostly unaware of his afib.  He has symptoms of palpitations.  Episodes are triggered at times by alcohol.  He has occasional dizziness.  He has OSA and is using CPAP since 09/2019.  Today, he denies symptoms of chest pain, shortness of breath, orthopnea, PND, lower extremity edema, claudication, dizziness, presyncope, syncope, bleeding, or neurologic sequela. The patient is tolerating medications without difficulties and is otherwise without complaint today.   he denies symptoms of cough, fevers, chills, or new SOB worrisome for COVID 19.   Past Medical History:  Diagnosis Date  . Arthritis   . BPH (benign prostatic hyperplasia)   . Callus of foot   . Chronic anxiety   . Depression   . GERD (gastroesophageal reflux disease)   . Hx of fracture of clavicle   .  Hypercholesteremia   . Paroxysmal atrial fibrillation (HCC)   . PONV (postoperative nausea and vomiting)    30 years ago   . Sleep apnea    uses CPAP  . TMJ syndrome   . Varicose vein of leg   . Wenckebach second degree AV block     Past Surgical History:  Procedure Laterality Date  . BUNIONECTOMY    . TEMPOROMANDIBULAR JOINT SURGERY    . TOTAL KNEE ARTHROPLASTY Right 06/17/2019   Procedure: TOTAL KNEE ARTHROPLASTY;  Surgeon: Vickey Huger, MD;  Location: [facility];  Service: Orthopedics;  Laterality: Right;    Current Outpatient Medications  Medication Sig Dispense Refill  . b complex vitamins tablet Take 1 tablet by mouth daily.    . citalopram (CELEXA) 20 MG tablet Take 20 mg by mouth every morning.    Marland Kitchen ibuprofen (ADVIL) 800 MG tablet Take 800 mg by mouth 2 (two) times daily as needed.    . milk thistle 175 MG tablet Take 175 mg by mouth daily.    . Multiple Vitamin (MULTIVITAMIN WITH MINERALS) TABS tablet Take 1 tablet by mouth daily.    . traZODone (DESYREL) 50 MG tablet Take 50 mg by mouth at bedtime.     No current facility-administered medications for this visit.    Allergies:   Patient has no known allergies.   Social History:  The patient  reports that he quit smoking about 11 years ago. His smoking use included cigarettes. He has a  5.00 pack-year smoking history. He has never used smokeless tobacco. He reports current alcohol use of about 2.0 standard drinks of alcohol per week. He reports that he does not use drugs.   Family History:  The patient's family history includes Breast cancer in his sister; Stroke in his father and mother.    ROS:  Please see the history of present illness.   All other systems are personally reviewed and negative.    Exam:    Vital Signs:  BP 105/84   Pulse 60   Ht 7' (2.134 m)   Wt 216 lb (98 kg)   BMI 21.52 kg/m    Well appearing, alert and conversant, regular work of breathing,  good skin color Eyes- anicteric, neuro- grossly  intact, skin- no apparent rash or lesions or cyanosis, mouth- oral mucosa is pink   Labs/Other Tests and Data Reviewed:    Recent Labs: 06/14/2019: ALT 30; BUN 13; Creatinine, Ser 1.05; Hemoglobin 13.4; Platelets 339; Potassium 4.0; Sodium 139   Wt Readings from Last 3 Encounters:  12/16/19 216 lb (98 kg)  10/28/19 216 lb 9.6 oz (98.2 kg)  07/29/19 212 lb (96.2 kg)     Other studies personally reviewed: Additional studies/ records that were reviewed today include: recent Zio monitor, records from primary care  Review of the above records today demonstrates: as above  ASSESSMENT & PLAN:    1.  Paroxysmal atrial fibnllation The patient has minimally symptomatic, recurrent paroxysmal atrial fibrillation. This is a new diagnosis Chads2vasc score is 1.  We discussed guidelines regarding anticoagulation at length.  He is clear in his decision to avoid anticoagulation at this time. Therapeutic strategies for afib including medicine and ablation were discussed in detail with the patient today.  I have advised diltiazem for rate control.  He would like to reduce his ETOH and then reassess his afib going forward prior to starting new medicines. This is a very reasonable option. We discussed options for monitoring his heart rhythm going forward including apple watch and long term monitoring with an ILR.  He is likely going to buy an apple watch.  Echo and myoview to assess for other causes of his afib. Given that he has not been very active recently and is limited by knee issues, I do think that myoview is necessary to evaluate for ischemia as a possible cause.  2 OSA Uses CPAP  3. ETOH Avoidance encouraged    Follow-up:  2 months with me  Current medicines are reviewed at length with the patient today.   The patient does not have concerns regarding his medicines.  The following changes were made today:  none  Labs/ tests ordered today include:  No orders of the defined types were  placed in this encounter.   Patient Risk:  after full review of this patients clinical status, I feel that they are at moderate risk at this time.   Today, I have spent 20 minutes with the patient with telehealth technology discussing afib .    Signed, Thompson Grayer MD, [place] 12/16/2019 11:50 AM   [facility] [address] [place] [address] [phone] (office) [phone] (fax)

## 2019-12-17 ENCOUNTER — Telehealth: Payer: Self-pay

## 2019-12-17 DIAGNOSIS — R9431 Abnormal electrocardiogram [ECG] [EKG]: Secondary | ICD-10-CM

## 2019-12-17 DIAGNOSIS — I480 Paroxysmal atrial fibrillation: Secondary | ICD-10-CM

## 2019-12-17 NOTE — Telephone Encounter (Signed)
Order placed for ECHO and myoview.  2 mo f/u scheduled.  Sent pt instruction letter regarding myoview.

## 2019-12-17 NOTE — Telephone Encounter (Signed)
-----   Message from Thompson Grayer, MD sent at 12/16/2019 11:59 AM EST ----- Echo- afib Myoview- afib   2 months with me

## 2019-12-18 DIAGNOSIS — G4733 Obstructive sleep apnea (adult) (pediatric): Secondary | ICD-10-CM | POA: Diagnosis not present

## 2019-12-23 ENCOUNTER — Ambulatory Visit: Payer: Medicare Other | Attending: Internal Medicine

## 2019-12-23 DIAGNOSIS — Z23 Encounter for immunization: Secondary | ICD-10-CM | POA: Insufficient documentation

## 2019-12-23 NOTE — Progress Notes (Signed)
   Covid-19 Vaccination Clinic  Name:  Ronnie Caldwell    MRN: [id number] DOB: [birth year]  12/23/2019  Mr. Wolinski was observed post Covid-19 immunization for 15 minutes without incidence. He was provided with Vaccine Information Sheet and instruction to access the V-Safe system.   Mr. Vlad was instructed to call 911 with any severe reactions post vaccine: Marland Kitchen Difficulty breathing  . Swelling of your face and throat  . A fast heartbeat  . A bad rash all over your body  . Dizziness and weakness    Immunizations Administered    Name Date Dose VIS Date Route   Pfizer COVID-19 Vaccine 12/23/2019  4:07 PM 0.3 mL 10/25/2019 Intramuscular   Manufacturer: Benjamin   Lot: [id number]   Botines: [id number]

## 2019-12-24 ENCOUNTER — Other Ambulatory Visit: Payer: Self-pay

## 2019-12-24 ENCOUNTER — Ambulatory Visit (HOSPITAL_COMMUNITY): Payer: Medicare Other | Attending: Cardiology

## 2019-12-24 ENCOUNTER — Telehealth (HOSPITAL_COMMUNITY): Payer: Self-pay

## 2019-12-24 DIAGNOSIS — I480 Paroxysmal atrial fibrillation: Secondary | ICD-10-CM | POA: Diagnosis not present

## 2019-12-24 DIAGNOSIS — R9431 Abnormal electrocardiogram [ECG] [EKG]: Secondary | ICD-10-CM | POA: Diagnosis not present

## 2019-12-24 NOTE — Telephone Encounter (Signed)
Instructions left for the patient on his answering machine. Asked to call back with any questions. S.Britain Anagnos EMTP

## 2019-12-26 ENCOUNTER — Other Ambulatory Visit: Payer: Self-pay

## 2019-12-26 ENCOUNTER — Ambulatory Visit (HOSPITAL_COMMUNITY): Payer: Medicare Other | Attending: Cardiology

## 2019-12-26 DIAGNOSIS — R9431 Abnormal electrocardiogram [ECG] [EKG]: Secondary | ICD-10-CM | POA: Diagnosis not present

## 2019-12-26 LAB — MYOCARDIAL PERFUSION IMAGING
LV dias vol: 151 mL (ref 62–150)
LV sys vol: 91 mL
Peak HR: 81 {beats}/min
Rest HR: 55 {beats}/min
SDS: 0
SRS: 0
SSS: 0
TID: 1.11

## 2019-12-26 MED ORDER — TECHNETIUM TC 99M TETROFOSMIN IV KIT
31.7000 | PACK | Freq: Once | INTRAVENOUS | Status: AC | PRN
  Administered 2019-12-26: 31.7 via INTRAVENOUS
  Filled 2019-12-26: qty 32

## 2019-12-26 MED ORDER — TECHNETIUM TC 99M TETROFOSMIN IV KIT
9.5000 | PACK | Freq: Once | INTRAVENOUS | Status: AC | PRN
  Administered 2019-12-26: 9.5 via INTRAVENOUS
  Filled 2019-12-26: qty 10

## 2019-12-26 MED ORDER — REGADENOSON 0.4 MG/5ML IV SOLN
0.4000 mg | Freq: Once | INTRAVENOUS | Status: AC
  Administered 2019-12-26: 0.4 mg via INTRAVENOUS

## 2020-01-15 DIAGNOSIS — G4733 Obstructive sleep apnea (adult) (pediatric): Secondary | ICD-10-CM | POA: Diagnosis not present

## 2020-02-14 ENCOUNTER — Telehealth: Payer: Self-pay

## 2020-02-14 NOTE — Telephone Encounter (Signed)
Called pt to confirm his appt on 02/17/20 would be virtual. Pt stated he does not see the need for a virtual appt and would like to cancel. Pt was advised to keep virtual and discuss any concerns with Dr. Rayann Heman, but declined. Pt was informed his appt will be cancelled and [place] would call to schedule an office visit. Pt questions and concerns were address.

## 2020-02-15 DIAGNOSIS — G4733 Obstructive sleep apnea (adult) (pediatric): Secondary | ICD-10-CM | POA: Diagnosis not present

## 2020-02-17 ENCOUNTER — Telehealth: Payer: Medicare Other | Admitting: Internal Medicine

## 2020-02-19 NOTE — Telephone Encounter (Signed)
Follow up    Pt was called to RS appt. He states that he did not want a virtual visit because when he had his last visit with Dr. Rayann Heman he mentioned a loop. Pt would like to proceed with a loop recorder. They talked about an apple watch but pt decided not to go with that option. Pt was scheduled for in office on May 12th. Informed pt I would let nurse know he wanted to proceed with loop.   Pt also has questions about if he should be taking aspirin.

## 2020-02-20 NOTE — Telephone Encounter (Signed)
Returned call to Pt.  Pt would like to have loop implant at upcoming appt.  Sent to precert.  Pt asking about taking aspirin.  Advised that aspirin would not prevent a stroke d/t afib.  Pt not tolerating aspirin (he started on his own).  He states he will stop taking it.

## 2020-03-16 DIAGNOSIS — G4733 Obstructive sleep apnea (adult) (pediatric): Secondary | ICD-10-CM | POA: Diagnosis not present

## 2020-03-25 ENCOUNTER — Ambulatory Visit: Payer: Medicare Other | Admitting: Internal Medicine

## 2020-03-25 ENCOUNTER — Other Ambulatory Visit: Payer: Self-pay

## 2020-03-25 ENCOUNTER — Encounter: Payer: Self-pay | Admitting: Internal Medicine

## 2020-03-25 VITALS — BP 82/60 | HR 64 | Ht 76.5 in | Wt 217.0 lb

## 2020-03-25 DIAGNOSIS — I480 Paroxysmal atrial fibrillation: Secondary | ICD-10-CM

## 2020-03-25 DIAGNOSIS — G4733 Obstructive sleep apnea (adult) (pediatric): Secondary | ICD-10-CM

## 2020-03-25 HISTORY — PX: OTHER SURGICAL HISTORY: SHX169

## 2020-03-25 NOTE — Progress Notes (Signed)
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  Since last being seen in our clinic, the patient reports doing very well.  He continues to have palpitations. + occasional dizziness.  Today, he denies symptoms of  chest pain, shortness of breath,  lower extremity edema, dizziness, presyncope, or syncope.  The patient is otherwise without complaint today.   Past Medical History:  Diagnosis Date  . Arthritis   . BPH (benign prostatic hyperplasia)   . Callus of foot   . Chronic anxiety   . Depression   . GERD (gastroesophageal reflux disease)   . Hx of fracture of clavicle   . Hypercholesteremia   . Paroxysmal atrial fibrillation (HCC)   . PONV (postoperative nausea and vomiting)    30 years ago   . Sleep apnea    uses CPAP  . TMJ syndrome   . Varicose vein of leg   . Wenckebach second degree AV block    Past Surgical History:  Procedure Laterality Date  . BUNIONECTOMY    . TEMPOROMANDIBULAR JOINT SURGERY    . TOTAL KNEE ARTHROPLASTY Right 06/17/2019   Procedure: TOTAL KNEE ARTHROPLASTY;  Surgeon: Vickey Huger, MD;  Location: [facility];  Service: Orthopedics;  Laterality: Right;    ROS- all systems are reviewed and negatives except as per HPI above  Current Outpatient Medications  Medication Sig Dispense Refill  . b complex vitamins tablet Take 1 tablet by mouth daily.    . citalopram (CELEXA) 20 MG tablet Take 20 mg by mouth every morning.    . milk thistle 175 MG tablet Take 175 mg by mouth daily.    . Multiple Vitamin (MULTIVITAMIN WITH MINERALS) TABS tablet Take 1 tablet by mouth daily.    . tadalafil (CIALIS) 5 MG tablet Take 5 mg by mouth daily as needed for erectile dysfunction.    . traZODone (DESYREL) 50 MG tablet Take 50 mg by mouth at bedtime.     No current facility-administered medications for this visit.    Physical Exam: Vitals:   03/25/20 0909  BP: (!) 82/60    Pulse: 64  SpO2: 92%  Weight: 217 lb (98.4 kg)  Height: 6' 4.5" (1.943 m)    GEN- The patient is well appearing, alert and oriented x 3 today.   Head- normocephalic, atraumatic Eyes-  Sclera clear, conjunctiva pink Ears- hearing intact Oropharynx- clear Lungs-  normal work of breathing Heart- Regular rate and rhythm  GI- soft, NT, ND, + BS Extremities- no clubbing, cyanosis, or edema  Wt Readings from Last 3 Encounters:  03/25/20 217 lb (98.4 kg)  12/26/19 216 lb (98 kg)  12/16/19 216 lb (98 kg)    EKG tracing ordered today is personally reviewed and shows sinus rhythm  Assessment and Plan:  1. Paroxysmal atrial fibrillation He has minimally symptomatic afib chads2vasc score is 1.  He did not start anticoagulation.  He has reduced ETOH.  He has palpitations of unclear etiology.  I think we need more data to further evaluate and management his afib.  I am ok with holding off on eliquis at this point.  We discussed pros and cons of anticoagulation at length.  We also discussed AVERROES data today.  I would therefore advise implantation of an implantable loop recorder for long term arrhythmia monitoring.  Risks and benefits to ILR were discussed at length with the patient today, including but  not limited to risks of bleeding and infection.  Extensive device education was performed.  Remote monitoring was also discussed at length today.  The patient understands and wishes to proceed.  We will proceed at this time with ILR implantation.  2. OSA Uses CPAP  Return to discuss AF in 4 weeks  Ronnie Grayer MD, [facility] 03/25/2020 9:16 AM      DESCRIPTION OF PROCEDURE:  Informed written consent was obtained.  The patient required no sedation for the procedure today.  The patients left chest was prepped and draped. Mapping over the patient's chest was performed to identify the appropriate ILR site.  This area was found to be the left  parasternal region over the 3rd-4th intercostal space.   The skin overlying this region was infiltrated with lidocaine for local analgesia.  A 0.5-cm incision was made at the implant site.  A subcutaneous ILR pocket was fashioned using a combination of sharp and blunt dissection.  A Medtronic Reveal Linq model LNQ 22 (SN [id number]) implantable loop recorder was then placed into the pocket R waves were very prominent and measured > 0.2 mV. EBL<1 ml.  Steri- Strips and a sterile dressing were then applied.  There were no early apparent complications.     CONCLUSIONS:   1. Successful implantation of a Medtronic Reveal LINQ implantable loop recorder for evaluation of palpitations and afib management  2. No early apparent complications.   Ronnie Grayer MD, [facility] 03/25/2020 9:16 AM

## 2020-03-25 NOTE — Patient Instructions (Addendum)
Medication Instructions:  Your physician recommends that you continue on your current medications as directed. Please refer to the Current Medication list given to you today.  *If you need a refill on your cardiac medications before your next appointment, please call your pharmacy*   Lab Work: None ordered   Testing/Procedures: You had a loop recorder implanted in-office today.   Follow-Up: At [facility], you and your health needs are our priority.  As part of our continuing mission to provide you with exceptional heart care, we have created designated Provider Care Teams.  These Care Teams include your primary Cardiologist (physician) and Advanced Practice Providers (APPs -  Physician Assistants and Nurse Practitioners) who all work together to provide you with the care you need, when you need it.  We recommend signing up for the patient portal called "MyChart".  Sign up information is provided on this After Visit Summary.  MyChart is used to connect with patients for Virtual Visits (Telemedicine).  Patients are able to view lab/test results, encounter notes, upcoming appointments, etc.  Non-urgent messages can be sent to your provider as well.   To learn more about what you can do with MyChart, go to [email or web address].    Your next appointment:   4 week(s)  The format for your next appointment:   In Person  Provider:   Thompson Grayer, MD   Thank you for choosing [facility]!!     Other Instructions

## 2020-04-03 ENCOUNTER — Telehealth: Payer: Self-pay | Admitting: *Deleted

## 2020-04-03 NOTE — Telephone Encounter (Signed)
LINQ II alert received for 43.3% AF burden since 04/02/20. Overall burden since implant 9.4%. Known PAF, not on OAC as CHA2DS2VASc 1. Longest episode 13hrs 50min. V rates elevated at times. Pt is scheduled for f/u on 05/13/20. Reprogrammed Carelink alerts for AF rates (>120bpm for 6hrs) and duration (23hrs/day) due to known PAF. Routed to Dr. Rayann Heman as Juluis Rainier.

## 2020-04-09 ENCOUNTER — Telehealth: Payer: Self-pay

## 2020-04-09 NOTE — Telephone Encounter (Signed)
Carelink alert received 04/09/20 for 1 new episode of AF/AFL? , lasted 13 hours and 10 minutes. AF burden decreased from 43.3% on 04/03/20 to 5.1% today. NO OAC at this time.  Per Epic note on 04/03/20; reprogrammed Carelink alerts for AF rates (>120bpm for 6hrs) and duration (23hrs/day) due to known PAF. Routing to Dr. Rayann Heman for review.

## 2020-04-16 DIAGNOSIS — G4733 Obstructive sleep apnea (adult) (pediatric): Secondary | ICD-10-CM | POA: Diagnosis not present

## 2020-04-24 ENCOUNTER — Telehealth: Payer: Self-pay

## 2020-04-24 NOTE — Telephone Encounter (Signed)
Alert 12 AF events w/ burden 28%. Known PAF, no OAC. Has appt w/ JA 05/13/20 to discuss. Longest 2 hours on 04/23/20. Presenting SR. Routing to Triage for increased duration of AF events and no OAC.  Spoke with pt, he as asymptomatic at time of episodes.  He was busy yesterday working in the yard and installing a ceiling fan, he does endorse that he may not have been well hydrated.  Pt also does report having had ETOH alst on 6/8.    Routing to MD as Juluis Rainier since last OV notes indicate to re-evaluate AF if burden increases/ longer episodes.  As previously noted pt does have appt upcoming on 6/30.

## 2020-04-27 ENCOUNTER — Ambulatory Visit (INDEPENDENT_AMBULATORY_CARE_PROVIDER_SITE_OTHER): Payer: Medicare Other | Admitting: *Deleted

## 2020-04-27 DIAGNOSIS — I480 Paroxysmal atrial fibrillation: Secondary | ICD-10-CM

## 2020-04-28 LAB — CUP PACEART REMOTE DEVICE CHECK
Date Time Interrogation Session: 20210615060850
Implantable Pulse Generator Implant Date: 20210512

## 2020-04-29 NOTE — Progress Notes (Signed)
Carelink Summary Report / Loop Recorder 

## 2020-05-10 LAB — CUP PACEART REMOTE DEVICE CHECK
Date Time Interrogation Session: 20210627000139
Implantable Pulse Generator Implant Date: 20210512

## 2020-05-13 ENCOUNTER — Ambulatory Visit: Payer: Medicare Other | Admitting: Internal Medicine

## 2020-05-13 ENCOUNTER — Encounter: Payer: Self-pay | Admitting: *Deleted

## 2020-05-13 ENCOUNTER — Other Ambulatory Visit: Payer: Self-pay

## 2020-05-13 ENCOUNTER — Encounter: Payer: Self-pay | Admitting: Internal Medicine

## 2020-05-13 VITALS — BP 94/62 | HR 59 | Ht 76.5 in | Wt 217.0 lb

## 2020-05-13 DIAGNOSIS — G4733 Obstructive sleep apnea (adult) (pediatric): Secondary | ICD-10-CM | POA: Diagnosis not present

## 2020-05-13 DIAGNOSIS — I480 Paroxysmal atrial fibrillation: Secondary | ICD-10-CM | POA: Insufficient documentation

## 2020-05-13 DIAGNOSIS — I4891 Unspecified atrial fibrillation: Secondary | ICD-10-CM | POA: Diagnosis not present

## 2020-05-13 MED ORDER — RIVAROXABAN 20 MG PO TABS
20.0000 mg | ORAL_TABLET | Freq: Every day | ORAL | 0 refills | Status: DC
Start: 2020-05-13 — End: 2020-05-13

## 2020-05-13 MED ORDER — RIVAROXABAN 20 MG PO TABS: 20.0000 mg | ORAL_TABLET | Freq: Every day | ORAL | 11 refills | Status: DC

## 2020-05-13 MED ORDER — METOPROLOL TARTRATE 50 MG PO TABS
ORAL_TABLET | ORAL | 0 refills | Status: DC
Start: 2020-05-13 — End: 2020-08-04

## 2020-05-13 NOTE — Patient Instructions (Signed)
Medication Instructions:  Your physician recommends that you continue on your current medications as directed. Please refer to the Current Medication list given to you today.  *If you need a refill on your cardiac medications before your next appointment, please call your pharmacy*  Lab Work: None ordered.  If you have labs (blood work) drawn today and your tests are completely normal, you will receive your results only by: . MyChart Message (if you have MyChart) OR . A paper copy in the mail If you have any lab test that is abnormal or we need to change your treatment, we will call you to review the results.  Testing/Procedures: None ordered.  Follow-Up: At [facility], you and your health needs are our priority.  As part of our continuing mission to provide you with exceptional heart care, we have created designated Provider Care Teams.  These Care Teams include your primary Cardiologist (physician) and Advanced Practice Providers (APPs -  Physician Assistants and Nurse Practitioners) who all work together to provide you with the care you need, when you need it.  We recommend signing up for the patient portal called "MyChart".  Sign up information is provided on this After Visit Summary.  MyChart is used to connect with patients for Virtual Visits (Telemedicine).  Patients are able to view lab/test results, encounter notes, upcoming appointments, etc.  Non-urgent messages can be sent to your provider as well.   To learn more about what you can do with MyChart, go to [email or web address].    Your next appointment:   Your physician wants you to follow-up in: 1 year with Dr. Allred. You will receive a reminder letter in the mail two months in advance. If you don't receive a letter, please call our office to schedule the follow-up appointment.   Other Instructions:  

## 2020-05-13 NOTE — Progress Notes (Signed)
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  Since last being seen in our clinic, the patient reports doing very well.  he is active.  By ILR, we find that fatigue and feeling "unwell" corresponds to afib with RVR with very fast V rates.  He has tried to reduce ETOh without improvement.  Today, he denies symptoms of palpitations, chest pain, shortness of breath,  lower extremity edema, dizziness, presyncope, or syncope.  The patient is otherwise without complaint today.   Past Medical History:  Diagnosis Date  . Arthritis   . BPH (benign prostatic hyperplasia)   . Callus of foot   . Chronic anxiety   . Depression   . GERD (gastroesophageal reflux disease)   . Hx of fracture of clavicle   . Hypercholesteremia   . Paroxysmal atrial fibrillation (HCC)   . PONV (postoperative nausea and vomiting)    30 years ago   . Sleep apnea    uses CPAP  . TMJ syndrome   . Varicose vein of leg   . Wenckebach second degree AV block    Past Surgical History:  Procedure Laterality Date  . BUNIONECTOMY    . implantable loop recorder placement  03/25/2020   Medtronic Reveal Linq model LNQ 22 (SN [id number]) implantable loop recorder implanted in office by Dr Rayann Heman  . TEMPOROMANDIBULAR JOINT SURGERY    . TOTAL KNEE ARTHROPLASTY Right 06/17/2019   Procedure: TOTAL KNEE ARTHROPLASTY;  Surgeon: Vickey Huger, MD;  Location: [facility];  Service: Orthopedics;  Laterality: Right;    ROS- all systems are reviewed and negatives except as per HPI above  Current Outpatient Medications  Medication Sig Dispense Refill  . b complex vitamins tablet Take 1 tablet by mouth daily.    . citalopram (CELEXA) 20 MG tablet Take 20 mg by mouth every morning.    . milk thistle 175 MG tablet Take 175 mg by mouth daily.    . Multiple Vitamin (MULTIVITAMIN WITH MINERALS) TABS tablet Take 1 tablet by mouth daily.    . tadalafil  (CIALIS) 5 MG tablet Take 5 mg by mouth daily as needed for erectile dysfunction.    . traZODone (DESYREL) 50 MG tablet Take 50 mg by mouth at bedtime.     No current facility-administered medications for this visit.    Physical Exam: Vitals:   05/13/20 1516  BP: 94/62  Pulse: (!) 59  SpO2: 94%  Weight: 217 lb (98.4 kg)  Height: 6' 4.5" (1.943 m)    GEN- The patient is well appearing, alert and oriented x 3 today.   Head- normocephalic, atraumatic Eyes-  Sclera clear, conjunctiva pink Ears- hearing intact Oropharynx- clear Lungs-   normal work of breathing Heart- Regular rate and rhythm  GI- soft,  Extremities- no clubbing, cyanosis, or edema  Wt Readings from Last 3 Encounters:  05/13/20 217 lb (98.4 kg)  03/25/20 217 lb (98.4 kg)  12/26/19 216 lb (98 kg)   Echo 2/21- EF 55%, mild RA enlargement, normal LA size  EKG tracing ordered today is personally reviewed and shows sinus rhythm  Assessment and Plan:  1. Paroxysmal atrial fibrillation Currently in sinus chads2vasc score is 1.   ETOH reduction encouraged The patient has symptomatic, recurrent paroxysmal atrial fibrillation. Medical therapy is limited by low BP and bradycardia.  He is symptomatic.  Therapeutic strategies for afib including medicine and ablation were discussed  in detail with the patient today. Risk, benefits, and alternatives to EP study and radiofrequency ablation for afib were also discussed in detail today. These risks include but are not limited to stroke, bleeding, vascular damage, tamponade, perforation, damage to the esophagus, lungs, and other structures, pulmonary vein stenosis, worsening renal function, and death. The patient understands these risk and wishes to proceed.  We will therefore proceed with catheter ablation at the next available time.  Carto, ICE, anesthesia are requested for the procedure.  Will also obtain cardiac CT prior to the procedure to exclude LAA thrombus and further  evaluate atrial anatomy. We will start xarelto 20mg  daily today and proceed with ablation after appropriate anticoagulation  2. OSA Uses CPAP   Risks, benefits and potential toxicities for medications prescribed and/or refilled reviewed with patient today.   Thompson Grayer MD, [facility] 05/13/2020 3:21 PM

## 2020-05-15 ENCOUNTER — Telehealth: Payer: Self-pay | Admitting: Internal Medicine

## 2020-05-15 NOTE — Telephone Encounter (Signed)
Returned call to Pt.  Pt would like to reschedule ablation for August 26 to better align with his wife's work schedule  Pt rescheduled.

## 2020-05-15 NOTE — Telephone Encounter (Signed)
Called patient to schedule follow up appointments for post afib ablation. Patient would like to discuss possible reschedule for procedure, currently scheduled for 07/07/20 with JA. He requested to speak with JA's RN regarding whether or not the procedure can be rescheduled for 07/09/20.

## 2020-05-16 DIAGNOSIS — G4733 Obstructive sleep apnea (adult) (pediatric): Secondary | ICD-10-CM | POA: Diagnosis not present

## 2020-05-25 ENCOUNTER — Telehealth: Payer: Self-pay | Admitting: Emergency Medicine

## 2020-05-25 NOTE — Telephone Encounter (Signed)
Alert received for pause at 2215 on 05/23/20 for a pause 5 second pause event. Patient confirmed that he was awake and was asymptomatic. Will contact our office if he has any symptoms or change in condition.

## 2020-06-01 ENCOUNTER — Ambulatory Visit (INDEPENDENT_AMBULATORY_CARE_PROVIDER_SITE_OTHER): Payer: Medicare Other | Admitting: *Deleted

## 2020-06-01 DIAGNOSIS — I480 Paroxysmal atrial fibrillation: Secondary | ICD-10-CM | POA: Diagnosis not present

## 2020-06-01 LAB — CUP PACEART REMOTE DEVICE CHECK
Date Time Interrogation Session: 20210718231144
Implantable Pulse Generator Implant Date: 20210512

## 2020-06-03 NOTE — Progress Notes (Signed)
Carelink Summary Report / Loop Recorder 

## 2020-06-15 ENCOUNTER — Other Ambulatory Visit: Payer: Medicare Other

## 2020-06-15 ENCOUNTER — Other Ambulatory Visit: Payer: Self-pay

## 2020-06-15 DIAGNOSIS — G4733 Obstructive sleep apnea (adult) (pediatric): Secondary | ICD-10-CM

## 2020-06-15 DIAGNOSIS — I4891 Unspecified atrial fibrillation: Secondary | ICD-10-CM

## 2020-06-15 DIAGNOSIS — I480 Paroxysmal atrial fibrillation: Secondary | ICD-10-CM

## 2020-06-16 DIAGNOSIS — G4733 Obstructive sleep apnea (adult) (pediatric): Secondary | ICD-10-CM | POA: Diagnosis not present

## 2020-06-16 LAB — CBC WITH DIFFERENTIAL/PLATELET
Basophils Absolute: 0.1 10*3/uL (ref 0.0–0.2)
Basos: 2 %
EOS (ABSOLUTE): 0.1 10*3/uL (ref 0.0–0.4)
Eos: 1 %
Hematocrit: 39.6 % (ref 37.5–51.0)
Hemoglobin: 13.8 g/dL (ref 13.0–17.7)
Immature Grans (Abs): 0 10*3/uL (ref 0.0–0.1)
Immature Granulocytes: 0 %
Lymphocytes Absolute: 1.8 10*3/uL (ref 0.7–3.1)
Lymphs: 40 %
MCH: 34.2 pg — ABNORMAL HIGH (ref 26.6–33.0)
MCHC: 34.8 g/dL (ref 31.5–35.7)
MCV: 98 fL — ABNORMAL HIGH (ref 79–97)
Monocytes Absolute: 0.3 10*3/uL (ref 0.1–0.9)
Monocytes: 6 %
Neutrophils Absolute: 2.3 10*3/uL (ref 1.4–7.0)
Neutrophils: 51 %
Platelets: 344 10*3/uL (ref 150–450)
RBC: 4.03 x10E6/uL — ABNORMAL LOW (ref 4.14–5.80)
RDW: 11.9 % (ref 11.6–15.4)
WBC: 4.5 10*3/uL (ref 3.4–10.8)

## 2020-06-16 LAB — BASIC METABOLIC PANEL
BUN/Creatinine Ratio: 11 (ref 10–24)
BUN: 12 mg/dL (ref 8–27)
CO2: 22 mmol/L (ref 20–29)
Calcium: 9.7 mg/dL (ref 8.6–10.2)
Chloride: 107 mmol/L — ABNORMAL HIGH (ref 96–106)
Creatinine, Ser: 1.08 mg/dL (ref 0.76–1.27)
GFR calc Af Amer: 82 mL/min/{1.73_m2} (ref >59)
GFR calc non Af Amer: 71 mL/min/{1.73_m2} (ref >59)
Glucose: 91 mg/dL (ref 65–99)
Potassium: 4.5 mmol/L (ref 3.5–5.2)
Sodium: 142 mmol/L (ref 134–144)

## 2020-07-01 ENCOUNTER — Telehealth (HOSPITAL_COMMUNITY): Payer: Self-pay | Admitting: Emergency Medicine

## 2020-07-01 NOTE — Telephone Encounter (Signed)
Reaching out to patient to offer assistance regarding upcoming cardiac imaging study; pt verbalizes understanding of appt date/time, parking situation and where to check in, pre-test NPO status and medications ordered, and verified current allergies; name and call back number provided for further questions should they arise Marchia Bond RN Navigator Cardiac Imaging [facility] [phone] office [phone] cell  Strict instructions given for metoprolol use for CTA. Pt verbalized understanding. (only if HR >55 bpm)

## 2020-07-03 ENCOUNTER — Telehealth: Payer: Self-pay | Admitting: Internal Medicine

## 2020-07-03 ENCOUNTER — Ambulatory Visit (HOSPITAL_COMMUNITY)
Admission: RE | Admit: 2020-07-03 | Discharge: 2020-07-03 | Disposition: A | Discharge status: Home / Self Care | Payer: Medicare Other | Source: Ambulatory Visit | Attending: Internal Medicine | Admitting: Internal Medicine

## 2020-07-03 ENCOUNTER — Other Ambulatory Visit: Payer: Self-pay

## 2020-07-03 DIAGNOSIS — I4891 Unspecified atrial fibrillation: Secondary | ICD-10-CM | POA: Diagnosis not present

## 2020-07-03 MED ORDER — IOHEXOL 350 MG/ML SOLN
80.0000 mL | Freq: Once | INTRAVENOUS | Status: AC | PRN
  Administered 2020-07-03: 80 mL via INTRAVENOUS

## 2020-07-03 NOTE — Telephone Encounter (Signed)
Pt rescheduled for 8/24 per request.

## 2020-07-03 NOTE — Telephone Encounter (Signed)
   Pt would like to r/s covid test on 07/07/2020

## 2020-07-04 ENCOUNTER — Other Ambulatory Visit (HOSPITAL_COMMUNITY): Payer: Medicare Other

## 2020-07-06 ENCOUNTER — Ambulatory Visit (INDEPENDENT_AMBULATORY_CARE_PROVIDER_SITE_OTHER): Payer: Medicare Other | Admitting: *Deleted

## 2020-07-06 ENCOUNTER — Other Ambulatory Visit (HOSPITAL_COMMUNITY): Payer: Medicare Other

## 2020-07-06 DIAGNOSIS — I480 Paroxysmal atrial fibrillation: Secondary | ICD-10-CM

## 2020-07-06 LAB — CUP PACEART REMOTE DEVICE CHECK
Date Time Interrogation Session: 20210823143145
Implantable Pulse Generator Implant Date: 20210512

## 2020-07-07 ENCOUNTER — Other Ambulatory Visit (HOSPITAL_COMMUNITY)
Admission: RE | Admit: 2020-07-07 | Discharge: 2020-07-07 | Disposition: A | Discharge status: Home / Self Care | Payer: Medicare Other | Source: Ambulatory Visit | Attending: Internal Medicine | Admitting: Internal Medicine

## 2020-07-07 DIAGNOSIS — Z01812 Encounter for preprocedural laboratory examination: Secondary | ICD-10-CM | POA: Diagnosis not present

## 2020-07-07 DIAGNOSIS — Z20822 Contact with and (suspected) exposure to covid-19: Secondary | ICD-10-CM | POA: Insufficient documentation

## 2020-07-07 LAB — SARS CORONAVIRUS 2 (TAT 6-24 HRS): SARS Coronavirus 2: NEGATIVE

## 2020-07-09 ENCOUNTER — Ambulatory Visit (HOSPITAL_COMMUNITY): Payer: Medicare Other | Admitting: Certified Registered Nurse Anesthetist

## 2020-07-09 ENCOUNTER — Ambulatory Visit (HOSPITAL_COMMUNITY)
Admission: RE | Admit: 2020-07-09 | Discharge: 2020-07-09 | Disposition: A | Discharge status: Home / Self Care | Payer: Medicare Other | Attending: Internal Medicine | Admitting: Internal Medicine

## 2020-07-09 ENCOUNTER — Ambulatory Visit (HOSPITAL_COMMUNITY)
Admission: RE | Disposition: A | Discharge status: Home / Self Care | Payer: Medicare Other | Source: Home / Self Care | Attending: Internal Medicine

## 2020-07-09 ENCOUNTER — Encounter (HOSPITAL_COMMUNITY): Payer: Self-pay | Admitting: Internal Medicine

## 2020-07-09 ENCOUNTER — Other Ambulatory Visit: Payer: Self-pay

## 2020-07-09 DIAGNOSIS — I480 Paroxysmal atrial fibrillation: Secondary | ICD-10-CM | POA: Insufficient documentation

## 2020-07-09 DIAGNOSIS — N400 Enlarged prostate without lower urinary tract symptoms: Secondary | ICD-10-CM | POA: Diagnosis not present

## 2020-07-09 DIAGNOSIS — Z79899 Other long term (current) drug therapy: Secondary | ICD-10-CM | POA: Diagnosis not present

## 2020-07-09 DIAGNOSIS — Z96651 Presence of right artificial knee joint: Secondary | ICD-10-CM | POA: Diagnosis not present

## 2020-07-09 DIAGNOSIS — F419 Anxiety disorder, unspecified: Secondary | ICD-10-CM | POA: Diagnosis not present

## 2020-07-09 DIAGNOSIS — M1990 Unspecified osteoarthritis, unspecified site: Secondary | ICD-10-CM | POA: Insufficient documentation

## 2020-07-09 DIAGNOSIS — K219 Gastro-esophageal reflux disease without esophagitis: Secondary | ICD-10-CM | POA: Insufficient documentation

## 2020-07-09 DIAGNOSIS — I483 Typical atrial flutter: Secondary | ICD-10-CM | POA: Diagnosis not present

## 2020-07-09 DIAGNOSIS — G4730 Sleep apnea, unspecified: Secondary | ICD-10-CM | POA: Insufficient documentation

## 2020-07-09 DIAGNOSIS — G4733 Obstructive sleep apnea (adult) (pediatric): Secondary | ICD-10-CM | POA: Diagnosis not present

## 2020-07-09 DIAGNOSIS — I4891 Unspecified atrial fibrillation: Secondary | ICD-10-CM | POA: Diagnosis not present

## 2020-07-09 DIAGNOSIS — E7800 Pure hypercholesterolemia, unspecified: Secondary | ICD-10-CM | POA: Insufficient documentation

## 2020-07-09 DIAGNOSIS — F329 Major depressive disorder, single episode, unspecified: Secondary | ICD-10-CM | POA: Insufficient documentation

## 2020-07-09 LAB — POCT ACTIVATED CLOTTING TIME
Activated Clotting Time: 235 seconds
Activated Clotting Time: 219 seconds

## 2020-07-09 SURGERY — ATRIAL FIBRILLATION ABLATION
Anesthesia: General

## 2020-07-09 MED ORDER — SUGAMMADEX SODIUM 200 MG/2ML IV SOLN
INTRAVENOUS | Status: DC | PRN
  Administered 2020-07-09: 200 mg via INTRAVENOUS

## 2020-07-09 MED ORDER — DEXAMETHASONE SODIUM PHOSPHATE 10 MG/ML IJ SOLN
INTRAMUSCULAR | Status: DC | PRN
  Administered 2020-07-09: 5 mg via INTRAVENOUS

## 2020-07-09 MED ORDER — SODIUM CHLORIDE 0.9 % IV SOLN: INTRAVENOUS | Status: DC

## 2020-07-09 MED ORDER — ACETAMINOPHEN 325 MG PO TABS: 650.0000 mg | ORAL_TABLET | ORAL | Status: DC | PRN

## 2020-07-09 MED ORDER — ISOPROTERENOL HCL 0.2 MG/ML IJ SOLN
INTRAMUSCULAR | Status: AC
  Filled 2020-07-09: qty 5

## 2020-07-09 MED ORDER — PHENYLEPHRINE 40 MCG/ML (10ML) SYRINGE FOR IV PUSH (FOR BLOOD PRESSURE SUPPORT)
PREFILLED_SYRINGE | INTRAVENOUS | Status: DC | PRN
  Administered 2020-07-09 (×2): 80 ug via INTRAVENOUS
  Administered 2020-07-09: 200 ug via INTRAVENOUS
  Administered 2020-07-09: 40 ug via INTRAVENOUS

## 2020-07-09 MED ORDER — MIDAZOLAM HCL (PF) 5 MG/ML IJ SOLN
INTRAMUSCULAR | Status: DC | PRN
  Administered 2020-07-09: 2 mg via INTRAVENOUS
  Administered 2020-07-09: 1 mg via INTRAVENOUS

## 2020-07-09 MED ORDER — PHENYLEPHRINE HCL-NACL 10-0.9 MG/250ML-% IV SOLN
INTRAVENOUS | Status: DC | PRN
Start: 2020-07-09 — End: 2020-07-09
  Administered 2020-07-09: 40 ug/min via INTRAVENOUS

## 2020-07-09 MED ORDER — HEPARIN SODIUM (PORCINE) 1000 UNIT/ML IJ SOLN
INTRAMUSCULAR | Status: AC
  Filled 2020-07-09: qty 1

## 2020-07-09 MED ORDER — ONDANSETRON HCL 4 MG/2ML IJ SOLN
INTRAMUSCULAR | Status: DC | PRN
  Administered 2020-07-09: 4 mg via INTRAVENOUS

## 2020-07-09 MED ORDER — MIDAZOLAM HCL 5 MG/5ML IJ SOLN
INTRAMUSCULAR | Status: AC
  Filled 2020-07-09: qty 5

## 2020-07-09 MED ORDER — HEPARIN (PORCINE) IN NACL 1000-0.9 UT/500ML-% IV SOLN
INTRAVENOUS | Status: AC
  Filled 2020-07-09: qty 500

## 2020-07-09 MED ORDER — HEPARIN SODIUM (PORCINE) 1000 UNIT/ML IJ SOLN
INTRAMUSCULAR | Status: DC | PRN
  Administered 2020-07-09: 1000 [IU] via INTRAVENOUS

## 2020-07-09 MED ORDER — HEPARIN (PORCINE) IN NACL 1000-0.9 UT/500ML-% IV SOLN
INTRAVENOUS | Status: DC | PRN
  Administered 2020-07-09: 500 mL

## 2020-07-09 MED ORDER — PROPOFOL 10 MG/ML IV BOLUS
INTRAVENOUS | Status: DC | PRN
  Administered 2020-07-09: 170 mg via INTRAVENOUS

## 2020-07-09 MED ORDER — HEPARIN SODIUM (PORCINE) 1000 UNIT/ML IJ SOLN
INTRAMUSCULAR | Status: DC | PRN
  Administered 2020-07-09 (×2): 5000 [IU] via INTRAVENOUS

## 2020-07-09 MED ORDER — LIDOCAINE HCL (CARDIAC) PF 100 MG/5ML IV SOSY
PREFILLED_SYRINGE | INTRAVENOUS | Status: DC | PRN
  Administered 2020-07-09: 60 mg via INTRAVENOUS

## 2020-07-09 MED ORDER — ISOPROTERENOL HCL 0.2 MG/ML IJ SOLN
INTRAVENOUS | Status: DC | PRN
  Administered 2020-07-09: 20 ug/min via INTRAVENOUS

## 2020-07-09 MED ORDER — PROTAMINE SULFATE 10 MG/ML IV SOLN
INTRAVENOUS | Status: DC | PRN
  Administered 2020-07-09: 40 mg via INTRAVENOUS

## 2020-07-09 MED ORDER — SODIUM CHLORIDE 0.9% FLUSH: 3.0000 mL | INTRAVENOUS | Status: DC | PRN

## 2020-07-09 MED ORDER — PANTOPRAZOLE SODIUM 40 MG PO TBEC: 40.0000 mg | DELAYED_RELEASE_TABLET | Freq: Every day | ORAL | 0 refills | Status: DC

## 2020-07-09 MED ORDER — ONDANSETRON HCL 4 MG/2ML IJ SOLN: 4.0000 mg | Freq: Four times a day (QID) | INTRAMUSCULAR | Status: DC | PRN

## 2020-07-09 MED ORDER — SODIUM CHLORIDE 0.9 % IV SOLN: 250.0000 mL | INTRAVENOUS | Status: DC | PRN

## 2020-07-09 MED ORDER — FENTANYL CITRATE (PF) 100 MCG/2ML IJ SOLN
INTRAMUSCULAR | Status: AC
  Filled 2020-07-09: qty 2

## 2020-07-09 MED ORDER — FENTANYL CITRATE (PF) 100 MCG/2ML IJ SOLN
INTRAMUSCULAR | Status: DC | PRN
  Administered 2020-07-09: 100 ug via INTRAVENOUS

## 2020-07-09 MED ORDER — ROCURONIUM BROMIDE 10 MG/ML (PF) SYRINGE
PREFILLED_SYRINGE | INTRAVENOUS | Status: DC | PRN
  Administered 2020-07-09: 60 mg via INTRAVENOUS

## 2020-07-09 SURGICAL SUPPLY — 20 items
BLANKET WARM UNDERBOD FULL ACC (MISCELLANEOUS) ×2 IMPLANT
CATH MAPPNG PENTARAY F 2-6-2MM (CATHETERS) IMPLANT
CATH SMTCH THERMOCOOL SF DF (CATHETERS) ×1 IMPLANT
CATH SOUNDSTAR ECO 8FR (CATHETERS) ×1 IMPLANT
CATH WEBSTER BI DIR CS D-F CRV (CATHETERS) ×1 IMPLANT
COVER SWIFTLINK CONNECTOR (BAG) ×2 IMPLANT
DEVICE CLOSURE PERCLS PRGLD 6F (VASCULAR PRODUCTS) IMPLANT
NDL BAYLIS TRANSSEPTAL 71CM (NEEDLE) IMPLANT
NEEDLE BAYLIS TRANSSEPTAL 71CM (NEEDLE) ×2 IMPLANT
PACK EP LATEX FREE (CUSTOM PROCEDURE TRAY) ×2
PACK EP LF (CUSTOM PROCEDURE TRAY) ×1 IMPLANT
PAD PRO RADIOLUCENT 2001M-C (PAD) ×2 IMPLANT
PATCH CARTO3 (PAD) ×1 IMPLANT
PENTARAY F 2-6-2MM (CATHETERS) ×2
PERCLOSE PROGLIDE 6F (VASCULAR PRODUCTS) ×10
SHEATH PINNACLE 7F 10CM (SHEATH) ×2 IMPLANT
SHEATH PINNACLE 9F 10CM (SHEATH) ×1 IMPLANT
SHEATH PROBE COVER 6X72 (BAG) ×1 IMPLANT
SHEATH SWARTZ TS SL2 63CM 8.5F (SHEATH) ×1 IMPLANT
TUBING SMART ABLATE COOLFLOW (TUBING) ×1 IMPLANT

## 2020-07-09 NOTE — Discharge Instructions (Signed)
Post procedure care instructions No driving for 4 days. No lifting over 5 lbs for 1 week. No vigorous or sexual activity for 1 week. You may return to work/your usual activities on 07/16/2020. Keep procedure site clean & dry. If you notice increased pain, swelling, bleeding or pus, call/return!  You may shower, but no soaking baths/hot tubs/pools for 1 week.    You have an appointment set up with the [facility].  Multiple studies have shown that being followed by a dedicated atrial fibrillation clinic in addition to the standard care you receive from your other physicians improves health. We believe that enrollment in the atrial fibrillation clinic will allow [place] to better care for you.   The phone number to the [facility] is [phone]. The clinic is staffed Monday through Friday from 8:30am to 5pm.  Parking Directions: The clinic is located in the Heart and Vascular Building connected to [facility]. 1)From [address] turn on to [address] and go to the 3rd entrance  (Heart and Vascular entrance) on the right. 2)Look to the right for Heart &Vascular Parking Garage. 3)A code for the entrance is required, for Sept it is 39.   4)Take the elevators to the 1st floor. Registration is in the room with the glass walls at the end of the hallway.  If you have any trouble parking or locating the clinic, please don't hesitate to call [phone]. Cardiac Ablation, Care After  This sheet gives you information about how to care for yourself after your procedure. Your health care provider may also give you more specific instructions. If you have problems or questions, contact your health care provider. What can I expect after the procedure? After the procedure, it is common to have:  Bruising around your puncture site.  Tenderness around your puncture site.  Skipped heartbeats.  Tiredness (fatigue).  Follow these instructions at home: Puncture site  care   Follow instructions from your health care provider about how to take care of your puncture site. Make sure you: ? If present, leave stitches (sutures), skin glue, or adhesive strips in place. These skin closures may need to stay in place for up to 2 weeks. If adhesive strip edges start to loosen and curl up, you may trim the loose edges. Do not remove adhesive strips completely unless your health care provider tells you to do that.  Check your puncture site every day for signs of infection. Check for: ? Redness, swelling, or pain. ? Fluid or blood. If your puncture site starts to bleed, lie down on your back, apply firm pressure to the area, and contact your health care provider. ? Warmth. ? Pus or a bad smell. Driving  Do not drive for at least 4 days after your procedure or however long your health care provider recommends. (Do not resume driving if you have previously been instructed not to drive for other health reasons.)  Do not drive or use heavy machinery while taking prescription pain medicine. Activity  Avoid activities that take a lot of effort for at least 7 days after your procedure.  Do not lift anything that is heavier than 5 lb (4.5 kg) for one week.   No sexual activity for 1 week.   Return to your normal activities as told by your health care provider. Ask your health care provider what activities are safe for you. General instructions  Take over-the-counter and prescription medicines only as told by your health care provider.  Do  not use any products that contain nicotine or tobacco, such as cigarettes and e-cigarettes. If you need help quitting, ask your health care provider.  You may shower after 24 hours, but Do not take baths, swim, or use a hot tub for 1 week.   Do not drink alcohol for 24 hours after your procedure.  Keep all follow-up visits as told by your health care provider. This is important. Contact a health care provider if:  You have  redness, mild swelling, or pain around your puncture site.  You have fluid or blood coming from your puncture site that stops after applying firm pressure to the area.  Your puncture site feels warm to the touch.  You have pus or a bad smell coming from your puncture site.  You have a fever.  You have chest pain or discomfort that spreads to your neck, jaw, or arm.  You are sweating a lot.  You feel nauseous.  You have a fast or irregular heartbeat.  You have shortness of breath.  You are dizzy or light-headed and feel the need to lie down.  You have pain or numbness in the arm or leg closest to your puncture site. Get help right away if:  Your puncture site suddenly swells.  Your puncture site is bleeding and the bleeding does not stop after applying firm pressure to the area. These symptoms may represent a serious problem that is an emergency. Do not wait to see if the symptoms will go away. Get medical help right away. Call your local emergency services (911 in the [place] Do not drive yourself to the hospital. Summary  After the procedure, it is normal to have bruising and tenderness at the puncture site in your groin, neck, or forearm.  Check your puncture site every day for signs of infection.  Get help right away if your puncture site is bleeding and the bleeding does not stop after applying firm pressure to the area. This is a medical emergency. This information is not intended to replace advice given to you by your health care provider. Make sure you discuss any questions you have with your health care provider.

## 2020-07-09 NOTE — H&P (Signed)
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 male who presents today for electrophysiology study and ablation.  Since last being seen in our clinic, the patient reports doing very well.  he is active.  By ILR, we find that fatigue and feeling "unwell" corresponds to afib with RVR with very fast V rates.  He has tried to reduce ETOh without improvement.  Today, he denies symptoms of palpitations, chest pain, shortness of breath,  lower extremity edema, dizziness, presyncope, or syncope.  The patient is otherwise without complaint today.       Past Medical History:  Diagnosis Date  . Arthritis   . BPH (benign prostatic hyperplasia)   . Callus of foot   . Chronic anxiety   . Depression   . GERD (gastroesophageal reflux disease)   . Hx of fracture of clavicle   . Hypercholesteremia   . Paroxysmal atrial fibrillation (HCC)   . PONV (postoperative nausea and vomiting)    30 years ago   . Sleep apnea    uses CPAP  . TMJ syndrome   . Varicose vein of leg   . Wenckebach second degree AV block         Past Surgical History:  Procedure Laterality Date  . BUNIONECTOMY    . implantable loop recorder placement  03/25/2020   Medtronic Reveal Linq model LNQ 22 (SN [id number]) implantable loop recorder implanted in office by Dr Rayann Heman  . TEMPOROMANDIBULAR JOINT SURGERY    . TOTAL KNEE ARTHROPLASTY Right 06/17/2019   Procedure: TOTAL KNEE ARTHROPLASTY;  Surgeon: Vickey Huger, MD;  Location: [facility];  Service: Orthopedics;  Laterality: Right;    ROS- all systems are reviewed and negatives except as per HPI above  Current Outpatient Medications  Medication Sig Dispense Refill  . b complex vitamins tablet Take 1 tablet by mouth daily.    . citalopram (CELEXA) 20 MG tablet Take 20 mg by mouth every morning.    . milk thistle 175 MG tablet Take 175 mg by mouth daily.    . Multiple Vitamin (MULTIVITAMIN WITH MINERALS) TABS tablet Take 1 tablet by mouth daily.     . tadalafil (CIALIS) 5 MG tablet Take 5 mg by mouth daily as needed for erectile dysfunction.    . traZODone (DESYREL) 50 MG tablet Take 50 mg by mouth at bedtime.     No current facility-administered medications for this visit.   Physical Exam: Vitals:   07/09/20 0843  BP: 91/62  Pulse: (!) 55  Resp: 18  Temp: 98.6 F (37 C)  TempSrc: Oral  SpO2: 98%  Weight: 95.3 kg  Height: 6\' 4"  (1.93 m)    GEN- The patient is well appearing, alert and oriented x 3 today.   Head- normocephalic, atraumatic Eyes-  Sclera clear, conjunctiva pink Ears- hearing intact Oropharynx- clear Neck- supple, Lungs- Clear to ausculation bilaterally, normal work of breathing Heart- Regular rate and rhythm, no murmurs, rubs or gallops, PMI not laterally displaced GI- soft, NT, ND, + BS Extremities- no clubbing, cyanosis, or edema     Assessment and Plan:  1. Paroxysmal atrial fibrillation  The patient has symptomatic, recurrent paroxysmal atrial fibrillation.  ILR is suggestive of atrial flutter also  he is anticoagulated with xarelto . Risk, benefits, and alternatives to EP study and radiofrequency ablation for afib/ atrial flutter were also discussed in detail today. These risks include but are not limited to stroke, bleeding, vascular damage, tamponade, perforation, damage to the esophagus, lungs,  and other structures, pulmonary vein stenosis, worsening renal function, and death. The patient understands these risk and wishes to proceed.   He reports compliance with xarelto Cardiac CT reviewed with him at length today  Thompson Grayer MD, [place] 07/09/2020 11:44 AM

## 2020-07-09 NOTE — Transfer of Care (Signed)
Immediate Anesthesia Transfer of Care Note  Patient: Ronnie Caldwell  Procedure(s) Performed: ATRIAL FIBRILLATION ABLATION (N/A )  Patient Location: Cath Lab PACU space  Anesthesia Type:General  Level of Consciousness: awake, alert , oriented and patient cooperative  Airway & Oxygen Therapy: Patient Spontanous Breathing and Patient connected to nasal cannula oxygen  Post-op Assessment: Report given to RN and Post -op Vital signs reviewed and stable  Post vital signs: Reviewed and stable  Last Vitals:  Vitals Value Taken Time  BP 92/54 1452  Temp    Pulse 59   Resp    SpO2 95     Last Pain:  Vitals:   07/09/20 0843  TempSrc: Oral         Complications: No complications documented.

## 2020-07-09 NOTE — Anesthesia Postprocedure Evaluation (Signed)
Anesthesia Post Note  Patient: Ronnie Caldwell  Procedure(s) Performed: ATRIAL FIBRILLATION ABLATION (N/A )     Patient location during evaluation: PACU Anesthesia Type: General Level of consciousness: awake and alert Pain management: pain level controlled Vital Signs Assessment: post-procedure vital signs reviewed and stable Respiratory status: spontaneous breathing, nonlabored ventilation and respiratory function stable Cardiovascular status: blood pressure returned to baseline and stable Postop Assessment: no apparent nausea or vomiting Anesthetic complications: no   No complications documented.  Last Vitals:  Vitals:   07/09/20 1528 07/09/20 1554  BP:  103/68  Pulse:  (!) 57  Resp:  18  Temp: 36.6 C   SpO2:  100%    Last Pain:  Vitals:   07/09/20 1541  TempSrc:   PainSc: 0-No pain                 Audry Pili

## 2020-07-09 NOTE — Anesthesia Procedure Notes (Signed)
Procedure Name: Intubation Date/Time: 07/09/2020 12:28 PM Performed by: Raenette Rover, CRNA Pre-anesthesia Checklist: Patient identified, Emergency Drugs available, Suction available and Patient being monitored Patient Re-evaluated:Patient Re-evaluated prior to induction Oxygen Delivery Method: Circle system utilized Preoxygenation: Pre-oxygenation with 100% oxygen Induction Type: IV induction Ventilation: Mask ventilation without difficulty Laryngoscope Size: Mac and 4 Grade View: Grade I Tube type: Oral Tube size: 7.5 mm Number of attempts: 1 Airway Equipment and Method: Stylet Placement Confirmation: ETT inserted through vocal cords under direct vision,  positive ETCO2 and breath sounds checked- equal and bilateral Secured at: 22 cm Tube secured with: Tape Dental Injury: Teeth and Oropharynx as per pre-operative assessment

## 2020-07-09 NOTE — Anesthesia Preprocedure Evaluation (Addendum)
Anesthesia Evaluation  Patient identified by MRN, date of birth, ID band Patient awake    Reviewed: Allergy & Precautions, NPO status , Patient's Chart, lab work & pertinent test results  History of Anesthesia Complications (+) PONV and history of anesthetic complications  Airway Mallampati: II  TM Distance: >3 FB Neck ROM: Full    Dental no notable dental hx.    Pulmonary sleep apnea and Continuous Positive Airway Pressure Ventilation , former smoker,    Pulmonary exam normal breath sounds clear to auscultation       Cardiovascular + dysrhythmias Atrial Fibrillation  Rhythm:Irregular Rate:Bradycardia     Neuro/Psych PSYCHIATRIC DISORDERS Anxiety Depression negative neurological ROS     GI/Hepatic negative GI ROS, Neg liver ROS,   Endo/Other  negative endocrine ROS  Renal/GU negative Renal ROS     Musculoskeletal negative musculoskeletal ROS (+)   Abdominal   Peds  Hematology HLD   Anesthesia Other Findings A-fib  Reproductive/Obstetrics                            Anesthesia Physical Anesthesia Plan  ASA: III  Anesthesia Plan: General   Post-op Pain Management:    Induction: Intravenous  PONV Risk Score and Plan: 3 and Ondansetron, Dexamethasone, Midazolam and Treatment may vary due to age or medical condition  Airway Management Planned: Oral ETT  Additional Equipment:   Intra-op Plan:   Post-operative Plan: Extubation in OR  Informed Consent: I have reviewed the patients History and Physical, chart, labs and discussed the procedure including the risks, benefits and alternatives for the proposed anesthesia with the patient or authorized representative who has indicated his/her understanding and acceptance.     Dental advisory given  Plan Discussed with: CRNA  Anesthesia Plan Comments:        Anesthesia Quick Evaluation

## 2020-07-10 ENCOUNTER — Encounter (HOSPITAL_COMMUNITY): Payer: Self-pay | Admitting: Internal Medicine

## 2020-07-10 MED FILL — Heparin Sodium (Porcine) Inj 1000 Unit/ML: INTRAMUSCULAR | Qty: 10 | Status: AC

## 2020-07-10 NOTE — Progress Notes (Signed)
Carelink Summary Report / Loop Recorder 

## 2020-07-17 DIAGNOSIS — G4733 Obstructive sleep apnea (adult) (pediatric): Secondary | ICD-10-CM | POA: Diagnosis not present

## 2020-07-24 DIAGNOSIS — I480 Paroxysmal atrial fibrillation: Secondary | ICD-10-CM | POA: Diagnosis not present

## 2020-07-24 DIAGNOSIS — E7800 Pure hypercholesterolemia, unspecified: Secondary | ICD-10-CM | POA: Diagnosis not present

## 2020-08-04 ENCOUNTER — Other Ambulatory Visit: Payer: Self-pay

## 2020-08-04 ENCOUNTER — Ambulatory Visit (HOSPITAL_COMMUNITY)
Admission: RE | Admit: 2020-08-04 | Discharge: 2020-08-04 | Disposition: A | Discharge status: Home / Self Care | Payer: Medicare Other | Source: Ambulatory Visit | Attending: Physician Assistant | Admitting: Physician Assistant

## 2020-08-04 ENCOUNTER — Encounter (HOSPITAL_COMMUNITY): Payer: Self-pay | Admitting: Physician Assistant

## 2020-08-04 VITALS — BP 114/60 | HR 56 | Ht 76.0 in | Wt 208.4 lb

## 2020-08-04 DIAGNOSIS — G4733 Obstructive sleep apnea (adult) (pediatric): Secondary | ICD-10-CM | POA: Diagnosis not present

## 2020-08-04 DIAGNOSIS — D6869 Other thrombophilia: Secondary | ICD-10-CM

## 2020-08-04 DIAGNOSIS — I480 Paroxysmal atrial fibrillation: Secondary | ICD-10-CM | POA: Insufficient documentation

## 2020-08-04 DIAGNOSIS — I2510 Atherosclerotic heart disease of native coronary artery without angina pectoris: Secondary | ICD-10-CM | POA: Diagnosis not present

## 2020-08-04 DIAGNOSIS — I4892 Unspecified atrial flutter: Secondary | ICD-10-CM | POA: Insufficient documentation

## 2020-08-04 NOTE — Progress Notes (Signed)
Primary Care Physician: [name], MD Primary Electrophysiologist: Dr Rayann Heman Referring Physician: Dr Josue Caldwell Ronnie is a 64 y.o. male with a history of paroxysmal atrial fibrillation and OSA who presents for follow up in the [facility]. Patient has a ILR which showed afib with RVR corresponding to his symptoms of fatigue. He admits alcohol is a trigger for him. He was seen by Dr Rayann Heman who recommended ablation. Patient is on Xarelto for a CHADS2VASC score of 2. He is s/p afib and atria flutter ablation on 07/09/20. He has not had symptoms of afib since the procedure. ILR shows very little afib. He denies CP, swallowing, or groin issues.   Today, he denies symptoms of palpitations, chest pain, shortness of breath, orthopnea, PND, lower extremity edema, dizziness, presyncope, syncope, snoring, daytime somnolence, bleeding, or neurologic sequela. The patient is tolerating medications without difficulties and is otherwise without complaint today.    Atrial Fibrillation Risk Factors:  he does have symptoms or diagnosis of sleep apnea. he is compliant with CPAP therapy. he does not have a history of rheumatic fever. he does have a history of alcohol use.  he has a BMI of Body mass index is 25.37 kg/m.Marland Kitchen Filed Weights   08/04/20 1410  Weight: 94.5 kg    Family History  Problem Relation Age of Onset  . Stroke Mother   . Stroke Father   . Breast cancer Sister      Atrial Fibrillation Management history:  Previous antiarrhythmic drugs: none Previous cardioversions: none Previous ablations: 07/09/20 CHADS2VASC score: 2 Anticoagulation history: Xarelto    Past Medical History:  Diagnosis Date  . Arthritis   . BPH (benign prostatic hyperplasia)   . Callus of foot   . Chronic anxiety   . Depression   . GERD (gastroesophageal reflux disease)   . Hx of fracture of clavicle   . Hypercholesteremia   . Paroxysmal atrial fibrillation (HCC)     . PONV (postoperative nausea and vomiting)    30 years ago   . Sleep apnea    uses CPAP  . TMJ syndrome   . Varicose vein of leg   . Wenckebach second degree AV block    Past Surgical History:  Procedure Laterality Date  . ATRIAL FIBRILLATION ABLATION N/A 07/09/2020   Procedure: ATRIAL FIBRILLATION ABLATION;  Surgeon: Thompson Grayer, MD;  Location: [facility];  Service: Cardiovascular;  Laterality: N/A;  . BUNIONECTOMY    . implantable loop recorder placement  03/25/2020   Medtronic Reveal Linq model LNQ 22 (SN [id number]) implantable loop recorder implanted in office by Dr Rayann Heman  . TEMPOROMANDIBULAR JOINT SURGERY    . TOTAL KNEE ARTHROPLASTY Right 06/17/2019   Procedure: TOTAL KNEE ARTHROPLASTY;  Surgeon: Vickey Huger, MD;  Location: [facility];  Service: Orthopedics;  Laterality: Right;    Current Outpatient Medications  Medication Sig Dispense Refill  . b complex vitamins tablet Take 1 tablet by mouth daily.    . citalopram (CELEXA) 20 MG tablet Take 20 mg by mouth every morning.    . milk thistle 175 MG tablet Take 175 mg by mouth daily.    . Multiple Vitamin (MULTIVITAMIN WITH MINERALS) TABS tablet Take 1 tablet by mouth daily.    . pantoprazole (PROTONIX) 40 MG tablet Take 1 tablet (40 mg total) by mouth daily. 45 tablet 0  . rivaroxaban (XARELTO) 20 MG TABS tablet Take 1 tablet (20 mg total) by mouth daily with supper. Luck  tablet 11  . tadalafil (CIALIS) 5 MG tablet Take 5 mg by mouth daily as needed for erectile dysfunction.     . traZODone (DESYREL) 50 MG tablet Take 50 mg by mouth at bedtime.     No current facility-administered medications for this encounter.    No Known Allergies  Social History   Socioeconomic History  . Marital status: Married    Spouse name: Not on file  . Number of children: Not on file  . Years of education: Not on file  . Highest education level: Not on file  Occupational History  . Not on file  Tobacco Use  . Smoking status: Former  Smoker    Packs/day: 0.25    Years: 20.00    Pack years: 5.00    Types: Cigarettes    Quit date: 2010    Years since quitting: 11.7  . Smokeless tobacco: Never Used  . Tobacco comment: Social  Vaping Use  . Vaping Use: Never used  Substance and Sexual Activity  . Alcohol use: Yes    Alcohol/week: 4.0 - 5.0 standard drinks    Types: 2 Shots of liquor, 2 - 3 Standard drinks or equivalent per week    Comment: "I drink too much alcohol",  20-25 drinks per week  . Drug use: Never  . Sexual activity: Not on file  Other Topics Concern  . 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   Social Determinants of Health   Financial Resource Strain:   . Difficulty of Paying Living Expenses: Not on file  Food Insecurity:   . Worried About Charity fundraiser in the Last Year: Not on file  . Ran Out of Food in the Last Year: Not on file  Transportation Needs:   . Lack of Transportation (Medical): Not on file  . Lack of Transportation (Non-Medical): Not on file  Physical Activity:   . Days of Exercise per Week: Not on file  . Minutes of Exercise per Session: Not on file  Stress:   . Feeling of Stress : Not on file  Social Connections:   . Frequency of Communication with Friends and Family: Not on file  . Frequency of Social Gatherings with Friends and Family: Not on file  . Attends Religious Services: Not on file  . Active Member of Clubs or Organizations: Not on file  . Attends Archivist Meetings: Not on file  . Marital Status: Not on file  Intimate Partner Violence:   . Fear of Current or Ex-Partner: Not on file  . Emotionally Abused: Not on file  . Physically Abused: Not on file  . Sexually Abused: Not on file     ROS- All systems are reviewed and negative except as per the HPI above.  Physical Exam: Vitals:   08/04/20 1410  BP: 114/60  Pulse: (!) 56    Weight: 94.5 kg  Height: 6\' 4"  (1.93 m)    GEN- The patient is well appearing, alert and oriented x 3 today.   Head- normocephalic, atraumatic Eyes-  Sclera clear, conjunctiva pink Ears- hearing intact Oropharynx- clear Neck- supple  Lungs- Clear to ausculation bilaterally, normal work of breathing Heart- Regular rate and rhythm, no murmurs, rubs or gallops  GI- soft, NT, ND, + BS Extremities- no clubbing, cyanosis, or edema MS- no significant deformity or  atrophy Skin- no rash or lesion Psych- euthymic mood, full affect Neuro- strength and sensation are intact  Wt Readings from Last 3 Encounters:  08/04/20 94.5 kg  07/09/20 95.3 kg  05/13/20 98.4 kg    EKG today demonstrates SB HR 56, PR 164, QRS 100, QTc 426  Echo 12/24/19 demonstrated  1. Left ventricular ejection fraction, by estimation, is 55 to 60%. The  left ventricle has normal function. The left ventrical has no regional  wall motion abnormalities. The left ventricular internal cavity size was  moderately dilated. Left ventricular  diastolic parameters are indeterminate.  2. Consider CT aorta to measure aorta.  3. Right ventricular systolic function is normal. The right ventricular  size is mildly enlarged. There is normal pulmonary artery systolic  pressure.  4. Right atrial size was mildly dilated.  5. Trivial mitral valve regurgitation.  6. The aortic valve is normal in structure and function. Aortic valve  regurgitation is not visualized. No aortic stenosis is present.  7. 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  8. The inferior vena cava is normal in size with greater than 50%  respiratory variability, suggesting right atrial pressure of 3 mmHg.   Epic records are reviewed at length today  CHA2DS2-VASc Score = 2  The patient's score is based upon: CHF History: 0 HTN History: 0 Age : 1 Diabetes History: 0 Stroke History: 0 Vascular Disease History:  1 Gender: 0      ASSESSMENT AND PLAN: 1. Paroxysmal Atrial Fibrillation/typical atrial flutter The patient's CHA2DS2-VASc score is 2, indicating a 2.2% annual risk of stroke.   ILR shows very little afib since the ablation. He has been asymptomatic.  Continue Xarelto 20 mg daily for at least 3 months post ablation with no missed doses.   2. Obstructive sleep apnea The importance of adequate treatment of sleep apnea was discussed today in order to improve our ability to maintain sinus rhythm long term. Patient reports compliance with CPAP therapy.  3. CAD Calcium score of 250 in the 69th percentile for age. Mild diffuse plaque in LAD and RCA. No anginal symptoms.   Follow up with Dr Rayann Heman as scheduled.    [facility] [address] [place], [place] [address] [phone] 08/04/2020 2:57 PM

## 2020-08-10 ENCOUNTER — Ambulatory Visit (INDEPENDENT_AMBULATORY_CARE_PROVIDER_SITE_OTHER): Payer: Medicare Other | Admitting: Emergency Medicine

## 2020-08-10 DIAGNOSIS — I480 Paroxysmal atrial fibrillation: Secondary | ICD-10-CM | POA: Diagnosis not present

## 2020-08-10 LAB — CUP PACEART REMOTE DEVICE CHECK
Date Time Interrogation Session: 20210927110812
Implantable Pulse Generator Implant Date: 20210512

## 2020-08-12 NOTE — Progress Notes (Signed)
Carelink Summary Report / Loop Recorder 

## 2020-08-16 DIAGNOSIS — G4733 Obstructive sleep apnea (adult) (pediatric): Secondary | ICD-10-CM | POA: Diagnosis not present

## 2020-09-14 ENCOUNTER — Ambulatory Visit (INDEPENDENT_AMBULATORY_CARE_PROVIDER_SITE_OTHER): Payer: Medicare Other

## 2020-09-14 DIAGNOSIS — I480 Paroxysmal atrial fibrillation: Secondary | ICD-10-CM

## 2020-09-14 LAB — CUP PACEART REMOTE DEVICE CHECK
Date Time Interrogation Session: 20211101141838
Implantable Pulse Generator Implant Date: 20210512

## 2020-09-16 DIAGNOSIS — N390 Urinary tract infection, site not specified: Secondary | ICD-10-CM | POA: Diagnosis not present

## 2020-09-16 DIAGNOSIS — R300 Dysuria: Secondary | ICD-10-CM | POA: Diagnosis not present

## 2020-09-16 NOTE — Progress Notes (Signed)
Carelink Summary Report / Loop Recorder 

## 2020-09-29 DIAGNOSIS — I480 Paroxysmal atrial fibrillation: Secondary | ICD-10-CM | POA: Diagnosis not present

## 2020-09-29 DIAGNOSIS — R748 Abnormal levels of other serum enzymes: Secondary | ICD-10-CM | POA: Diagnosis not present

## 2020-09-29 DIAGNOSIS — Z0000 Encounter for general adult medical examination without abnormal findings: Secondary | ICD-10-CM | POA: Diagnosis not present

## 2020-09-29 DIAGNOSIS — R749 Abnormal serum enzyme level, unspecified: Secondary | ICD-10-CM | POA: Diagnosis not present

## 2020-10-05 DIAGNOSIS — R748 Abnormal levels of other serum enzymes: Secondary | ICD-10-CM | POA: Diagnosis not present

## 2020-10-05 DIAGNOSIS — Z23 Encounter for immunization: Secondary | ICD-10-CM | POA: Diagnosis not present

## 2020-10-05 DIAGNOSIS — Z0000 Encounter for general adult medical examination without abnormal findings: Secondary | ICD-10-CM | POA: Diagnosis not present

## 2020-10-15 DIAGNOSIS — N390 Urinary tract infection, site not specified: Secondary | ICD-10-CM | POA: Diagnosis not present

## 2020-10-15 DIAGNOSIS — R300 Dysuria: Secondary | ICD-10-CM | POA: Diagnosis not present

## 2020-10-16 DIAGNOSIS — N3000 Acute cystitis without hematuria: Secondary | ICD-10-CM | POA: Diagnosis not present

## 2020-10-19 ENCOUNTER — Ambulatory Visit (INDEPENDENT_AMBULATORY_CARE_PROVIDER_SITE_OTHER): Payer: Medicare Other | Admitting: Internal Medicine

## 2020-10-19 ENCOUNTER — Ambulatory Visit (INDEPENDENT_AMBULATORY_CARE_PROVIDER_SITE_OTHER): Payer: Medicare Other

## 2020-10-19 ENCOUNTER — Other Ambulatory Visit: Payer: Self-pay

## 2020-10-19 ENCOUNTER — Encounter: Payer: Self-pay | Admitting: Internal Medicine

## 2020-10-19 VITALS — BP 108/68 | HR 64 | Ht 76.0 in | Wt 208.6 lb

## 2020-10-19 DIAGNOSIS — G4733 Obstructive sleep apnea (adult) (pediatric): Secondary | ICD-10-CM | POA: Diagnosis not present

## 2020-10-19 DIAGNOSIS — I480 Paroxysmal atrial fibrillation: Secondary | ICD-10-CM

## 2020-10-19 NOTE — Progress Notes (Signed)
PCP: [name], MD    Ronnie Caldwell is a 64 y.o. male who presents today for routine electrophysiology followup.  Since his recent afib ablation, the patient reports doing very well.  he denies procedure related complications and is pleased with the results of the procedure.  Today, he denies symptoms of palpitations, chest pain, shortness of breath,  lower extremity edema, dizziness, presyncope, or syncope.  The patient is otherwise without complaint today.   Past Medical History:  Diagnosis Date  . Arthritis   . BPH (benign prostatic hyperplasia)   . Callus of foot   . Chronic anxiety   . Depression   . GERD (gastroesophageal reflux disease)   . Hx of fracture of clavicle   . Hypercholesteremia   . Paroxysmal atrial fibrillation (HCC)   . PONV (postoperative nausea and vomiting)    30 years ago   . Sleep apnea    uses CPAP  . TMJ syndrome   . Varicose vein of leg   . Wenckebach second degree AV block    Past Surgical History:  Procedure Laterality Date  . ATRIAL FIBRILLATION ABLATION N/A 07/09/2020   Procedure: ATRIAL FIBRILLATION ABLATION;  Surgeon: Thompson Grayer, MD;  Location: [facility];  Service: Cardiovascular;  Laterality: N/A;  . BUNIONECTOMY    . implantable loop recorder placement  03/25/2020   Medtronic Reveal Linq model LNQ 22 (SN [id number]) implantable loop recorder implanted in office by Dr Rayann Heman  . TEMPOROMANDIBULAR JOINT SURGERY    . TOTAL KNEE ARTHROPLASTY Right 06/17/2019   Procedure: TOTAL KNEE ARTHROPLASTY;  Surgeon: Vickey Huger, MD;  Location: [facility];  Service: Orthopedics;  Laterality: Right;    ROS- all systems are personally reviewed and negatives except as per HPI above  Current Outpatient Medications  Medication Sig Dispense Refill  . b complex vitamins tablet Take 1 tablet by mouth daily.    . ciprofloxacin (CIPRO) 500 MG tablet Take 500 mg by mouth 2 (two) times daily.    . citalopram (CELEXA) 20 MG tablet Take 20 mg by mouth  every morning.    . milk thistle 175 MG tablet Take 175 mg by mouth daily.    . Multiple Vitamin (MULTIVITAMIN WITH MINERALS) TABS tablet Take 1 tablet by mouth daily.    . rivaroxaban (XARELTO) 20 MG TABS tablet Take 1 tablet (20 mg total) by mouth daily with supper. 30 tablet 11  . tadalafil (CIALIS) 5 MG tablet Take 5 mg by mouth daily as needed for erectile dysfunction.     . tamsulosin (FLOMAX) 0.4 MG CAPS capsule Take 0.4 mg by mouth daily.    . traZODone (DESYREL) 50 MG tablet Take 50 mg by mouth at bedtime.     No current facility-administered medications for this visit.    Physical Exam: Vitals:   10/19/20 1420  BP: 108/68  Pulse: 64  SpO2: 93%  Weight: 208 lb 9.6 oz (94.6 kg)  Height: 6\' 4"  (1.93 m)    GEN- The patient is well appearing, alert and oriented x 3 today.   Head- normocephalic, atraumatic Eyes-  Sclera clear, conjunctiva pink Ears- hearing intact Oropharynx- clear Lungs- Clear to ausculation bilaterally, normal work of breathing Heart- Regular rate and rhythm, no murmurs, rubs or gallops, PMI not laterally displaced GI- soft, NT, ND, + BS Extremities- no clubbing, cyanosis, or edema  EKG tracing ordered today is personally reviewed and shows sinus  Assessment and Plan:  1. Paroxysmal atrial fibrillation atrial flutter Doing well  s/p ablation chads2vasc score is 1.  We will continue xarelto for now and consider stopping on return  2. HL We discussed at length.  His calcium score is 69%.  I have advised statin.  He wishes to avoid for now.  Return to see me in 3 months  Thompson Grayer MD, [facility] 10/19/2020 2:33 PM

## 2020-10-19 NOTE — Patient Instructions (Addendum)
Medication Instructions:  Your physician recommends that you continue on your current medications as directed. Please refer to the Current Medication list given to you today.  *If you need a refill on your cardiac medications before your next appointment, please call your pharmacy*  Lab Work: None ordered.  If you have labs (blood work) drawn today and your tests are completely normal, you will receive your results only by: Marland Kitchen MyChart Message (if you have MyChart) OR . A paper copy in the mail If you have any lab test that is abnormal or we need to change your treatment, we will call you to review the results.  Testing/Procedures: None ordered.  Follow-Up: At [facility], you and your health needs are our priority.  As part of our continuing mission to provide you with exceptional heart care, we have created designated Provider Care Teams.  These Care Teams include your primary Cardiologist (physician) and Advanced Practice Providers (APPs -  Physician Assistants and Nurse Practitioners) who all work together to provide you with the care you need, when you need it.  We recommend signing up for the patient portal called "MyChart".  Sign up information is provided on this After Visit Summary.  MyChart is used to connect with patients for Virtual Visits (Telemedicine).  Patients are able to view lab/test results, encounter notes, upcoming appointments, etc.  Non-urgent messages can be sent to your provider as well.   To learn more about what you can do with MyChart, go to [email or web address].    Your next appointment:   Your physician wants you to follow-up in: 01/18/21 with Dr. Rayann Heman.   Other Instructions:

## 2020-10-20 DIAGNOSIS — D225 Melanocytic nevi of trunk: Secondary | ICD-10-CM | POA: Diagnosis not present

## 2020-10-20 DIAGNOSIS — B078 Other viral warts: Secondary | ICD-10-CM | POA: Diagnosis not present

## 2020-10-20 DIAGNOSIS — Z1283 Encounter for screening for malignant neoplasm of skin: Secondary | ICD-10-CM | POA: Diagnosis not present

## 2020-10-20 DIAGNOSIS — L84 Corns and callosities: Secondary | ICD-10-CM | POA: Diagnosis not present

## 2020-10-20 DIAGNOSIS — I8310 Varicose veins of unspecified lower extremity with inflammation: Secondary | ICD-10-CM | POA: Diagnosis not present

## 2020-10-21 LAB — CUP PACEART REMOTE DEVICE CHECK
Date Time Interrogation Session: 20211206093035
Implantable Pulse Generator Implant Date: 20210512

## 2020-10-28 NOTE — Progress Notes (Signed)
Carelink Summary Report / Loop Recorder 

## 2020-11-23 ENCOUNTER — Ambulatory Visit (INDEPENDENT_AMBULATORY_CARE_PROVIDER_SITE_OTHER): Payer: Medicare Other

## 2020-11-23 DIAGNOSIS — I480 Paroxysmal atrial fibrillation: Secondary | ICD-10-CM | POA: Diagnosis not present

## 2020-11-23 LAB — CUP PACEART REMOTE DEVICE CHECK
Date Time Interrogation Session: 20220110122902
Implantable Pulse Generator Implant Date: 20210512

## 2020-12-07 NOTE — Progress Notes (Signed)
Carelink Summary Report / Loop Recorder 

## 2020-12-27 LAB — CUP PACEART REMOTE DEVICE CHECK
Date Time Interrogation Session: 20220213080117
Implantable Pulse Generator Implant Date: 20210512

## 2020-12-28 ENCOUNTER — Ambulatory Visit (INDEPENDENT_AMBULATORY_CARE_PROVIDER_SITE_OTHER): Payer: Medicare Other

## 2020-12-28 DIAGNOSIS — I480 Paroxysmal atrial fibrillation: Secondary | ICD-10-CM

## 2021-01-01 NOTE — Progress Notes (Signed)
Carelink Summary Report / Loop Recorder 

## 2021-01-18 ENCOUNTER — Other Ambulatory Visit: Payer: Self-pay

## 2021-01-18 ENCOUNTER — Encounter: Payer: Self-pay | Admitting: Internal Medicine

## 2021-01-18 ENCOUNTER — Ambulatory Visit: Payer: Medicare Other | Admitting: Internal Medicine

## 2021-01-18 VITALS — BP 104/62 | HR 67 | Ht 76.0 in | Wt 218.0 lb

## 2021-01-18 DIAGNOSIS — I480 Paroxysmal atrial fibrillation: Secondary | ICD-10-CM

## 2021-01-18 NOTE — Patient Instructions (Addendum)
Medication Instructions:  Your physician has recommended you make the following change in your medication:   1.  STOP Xarelto   Labwork: None ordered.  Testing/Procedures: None ordered.  Follow-Up: Your physician wants you to follow-up in: 6 months with Dr. Rayann Heman.      Any Other Special Instructions Will Be Listed Below (If Applicable).  If you need a refill on your cardiac medications before your next appointment, please call your pharmacy.

## 2021-01-18 NOTE — Progress Notes (Signed)
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 male who presents today for routine electrophysiology followup.  Since last being seen in our clinic, the patient reports doing very well.  Today, he denies symptoms of palpitations, chest pain, shortness of breath,  lower extremity edema, dizziness, presyncope, or syncope.  The patient is otherwise without complaint today.   Past Medical History:  Diagnosis Date  . Arthritis   . BPH (benign prostatic hyperplasia)   . Callus of foot   . Chronic anxiety   . Depression   . GERD (gastroesophageal reflux disease)   . Hx of fracture of clavicle   . Hypercholesteremia   . Paroxysmal atrial fibrillation (HCC)   . PONV (postoperative nausea and vomiting)    30 years ago   . Sleep apnea    uses CPAP  . TMJ syndrome   . Varicose vein of leg   . Wenckebach second degree AV block    Past Surgical History:  Procedure Laterality Date  . ATRIAL FIBRILLATION ABLATION N/A 07/09/2020   Procedure: ATRIAL FIBRILLATION ABLATION;  Surgeon: Thompson Grayer, MD;  Location: [facility];  Service: Cardiovascular;  Laterality: N/A;  . BUNIONECTOMY    . implantable loop recorder placement  03/25/2020   Medtronic Reveal Linq model LNQ 22 (SN [id number]) implantable loop recorder implanted in office by Dr Rayann Heman  . TEMPOROMANDIBULAR JOINT SURGERY    . TOTAL KNEE ARTHROPLASTY Right 06/17/2019   Procedure: TOTAL KNEE ARTHROPLASTY;  Surgeon: Vickey Huger, MD;  Location: [facility];  Service: Orthopedics;  Laterality: Right;    ROS- all systems are reviewed and negatives except as per HPI above  Current Outpatient Medications  Medication Sig Dispense Refill  . b complex vitamins tablet Take 1 tablet by mouth daily.    . citalopram (CELEXA) 20 MG tablet Take 20 mg by mouth every morning.    . milk thistle 175 MG tablet Take 175 mg by mouth daily.    . Multiple Vitamin (MULTIVITAMIN WITH MINERALS) TABS tablet Take 1 tablet by  mouth daily.    . rivaroxaban (XARELTO) 20 MG TABS tablet Take 1 tablet (20 mg total) by mouth daily with supper. 30 tablet 11  . tadalafil (CIALIS) 5 MG tablet Take 5 mg by mouth daily as needed for erectile dysfunction.     . tamsulosin (FLOMAX) 0.4 MG CAPS capsule Take 0.4 mg by mouth daily.    . traZODone (DESYREL) 50 MG tablet Take 50 mg by mouth at bedtime.     No current facility-administered medications for this visit.    Physical Exam: Vitals:   01/18/21 1446  BP: 104/62  Pulse: 67  SpO2: 97%  Weight: 218 lb (98.9 kg)  Height: 6\' 4"  (1.93 m)    GEN- The patient is well appearing, alert and oriented x 3 today.   Head- normocephalic, atraumatic Eyes-  Sclera clear, conjunctiva pink Ears- hearing intact Oropharynx- clear Lungs- Clear to ausculation bilaterally, normal work of breathing Heart- Regular rate and rhythm, no murmurs, rubs or gallops, PMI not laterally displaced GI- soft, NT, ND, + BS Extremities- no clubbing, cyanosis, or edema  Wt Readings from Last 3 Encounters:  01/18/21 218 lb (98.9 kg)  10/19/20 208 lb 9.6 oz (94.6 kg)  08/04/20 208 lb 6.4 oz (94.5 kg)    EKG tracing ordered today is personally reviewed and shows sinus  Assessment and Plan:  1. Paroxysmal atrial fibrillation and atrial flutter  afib burden is 0% chads2vasc score is 1. stop xarelto  2. HL We discussed at length.  His calcium score is 69%.  I have advised statin.  He wishes to avoid for now.  Risks, benefits and potential toxicities for medications prescribed and/or refilled reviewed with patient today.   Return in 6 months  Thompson Grayer MD, [facility] 01/18/2021 3:03 PM

## 2021-01-19 DIAGNOSIS — R3915 Urgency of urination: Secondary | ICD-10-CM | POA: Diagnosis not present

## 2021-01-19 DIAGNOSIS — R3121 Asymptomatic microscopic hematuria: Secondary | ICD-10-CM | POA: Diagnosis not present

## 2021-02-01 ENCOUNTER — Ambulatory Visit (INDEPENDENT_AMBULATORY_CARE_PROVIDER_SITE_OTHER): Payer: Medicare Other

## 2021-02-01 DIAGNOSIS — I480 Paroxysmal atrial fibrillation: Secondary | ICD-10-CM

## 2021-02-01 LAB — CUP PACEART REMOTE DEVICE CHECK
Date Time Interrogation Session: 20220321075103
Implantable Pulse Generator Implant Date: 20210512

## 2021-02-09 NOTE — Progress Notes (Signed)
Carelink Summary Report / Loop Recorder 

## 2021-03-01 ENCOUNTER — Ambulatory Visit (INDEPENDENT_AMBULATORY_CARE_PROVIDER_SITE_OTHER): Payer: Medicare Other

## 2021-03-01 DIAGNOSIS — I480 Paroxysmal atrial fibrillation: Secondary | ICD-10-CM

## 2021-03-04 LAB — CUP PACEART REMOTE DEVICE CHECK
Date Time Interrogation Session: 20220421065908
Implantable Pulse Generator Implant Date: 20210512

## 2021-03-17 NOTE — Progress Notes (Signed)
Carelink Summary Report / Loop Recorder 

## 2021-04-05 ENCOUNTER — Ambulatory Visit (INDEPENDENT_AMBULATORY_CARE_PROVIDER_SITE_OTHER): Payer: Medicare Other

## 2021-04-05 DIAGNOSIS — I480 Paroxysmal atrial fibrillation: Secondary | ICD-10-CM

## 2021-04-05 DIAGNOSIS — Z86010 Personal history of colonic polyps: Secondary | ICD-10-CM | POA: Diagnosis not present

## 2021-04-08 LAB — CUP PACEART REMOTE DEVICE CHECK
Date Time Interrogation Session: 20220526073834
Implantable Pulse Generator Implant Date: 20210512

## 2021-04-14 DIAGNOSIS — R194 Change in bowel habit: Secondary | ICD-10-CM | POA: Diagnosis not present

## 2021-04-14 DIAGNOSIS — Z86010 Personal history of colonic polyps: Secondary | ICD-10-CM | POA: Diagnosis not present

## 2021-04-14 DIAGNOSIS — K5900 Constipation, unspecified: Secondary | ICD-10-CM | POA: Diagnosis not present

## 2021-04-27 NOTE — Progress Notes (Signed)
Carelink Summary Report / Loop Recorder 

## 2021-05-06 LAB — CUP PACEART REMOTE DEVICE CHECK
Date Time Interrogation Session: 20220623095718
Implantable Pulse Generator Implant Date: 20210512

## 2021-05-10 ENCOUNTER — Ambulatory Visit (INDEPENDENT_AMBULATORY_CARE_PROVIDER_SITE_OTHER): Payer: Medicare Other

## 2021-05-10 DIAGNOSIS — I480 Paroxysmal atrial fibrillation: Secondary | ICD-10-CM | POA: Diagnosis not present

## 2021-05-26 ENCOUNTER — Other Ambulatory Visit: Payer: Self-pay | Admitting: Internal Medicine

## 2021-05-26 NOTE — Telephone Encounter (Signed)
Prescription refill request for Xarelto received.  Indication: Atrial fib Last office visit: 01/18/21 Clearnce Hasten MD Weight: 98.9kg Age:65 Scr:  1.08 on 06/15/20 CrCl: 92.85  Per office note 01/14/21 Xarelto was discontinue by Dr Rayann Heman.  Refill denied.

## 2021-05-26 NOTE — Telephone Encounter (Deleted)
R

## 2021-05-28 NOTE — Progress Notes (Signed)
Carelink Summary Report / Loop Recorder 

## 2021-06-08 DIAGNOSIS — Z86010 Personal history of colonic polyps: Secondary | ICD-10-CM | POA: Diagnosis not present

## 2021-06-08 DIAGNOSIS — K5730 Diverticulosis of large intestine without perforation or abscess without bleeding: Secondary | ICD-10-CM | POA: Diagnosis not present

## 2021-06-10 ENCOUNTER — Ambulatory Visit (INDEPENDENT_AMBULATORY_CARE_PROVIDER_SITE_OTHER): Payer: Medicare Other

## 2021-06-10 DIAGNOSIS — I480 Paroxysmal atrial fibrillation: Secondary | ICD-10-CM | POA: Diagnosis not present

## 2021-06-12 LAB — CUP PACEART REMOTE DEVICE CHECK
Date Time Interrogation Session: 20220729200705
Implantable Pulse Generator Implant Date: 20210512

## 2021-07-07 NOTE — Progress Notes (Signed)
Carelink Summary Report / Loop Recorder 

## 2021-07-13 ENCOUNTER — Ambulatory Visit (INDEPENDENT_AMBULATORY_CARE_PROVIDER_SITE_OTHER): Payer: Medicare Other

## 2021-07-13 DIAGNOSIS — I480 Paroxysmal atrial fibrillation: Secondary | ICD-10-CM

## 2021-07-14 LAB — CUP PACEART REMOTE DEVICE CHECK
Date Time Interrogation Session: 20220831100634
Implantable Pulse Generator Implant Date: 20210512

## 2021-07-26 ENCOUNTER — Other Ambulatory Visit: Payer: Self-pay

## 2021-07-26 ENCOUNTER — Ambulatory Visit: Payer: Medicare Other | Admitting: Internal Medicine

## 2021-07-26 ENCOUNTER — Encounter: Payer: Self-pay | Admitting: Internal Medicine

## 2021-07-26 VITALS — BP 108/68 | HR 70 | Ht 76.0 in | Wt 211.8 lb

## 2021-07-26 DIAGNOSIS — I4892 Unspecified atrial flutter: Secondary | ICD-10-CM

## 2021-07-26 DIAGNOSIS — E785 Hyperlipidemia, unspecified: Secondary | ICD-10-CM | POA: Diagnosis not present

## 2021-07-26 DIAGNOSIS — I480 Paroxysmal atrial fibrillation: Secondary | ICD-10-CM

## 2021-07-26 NOTE — Progress Notes (Signed)
Carelink Summary Report / Loop Recorder 

## 2021-07-26 NOTE — Progress Notes (Signed)
PCP: Ronnie Pretty, MD   Primary EP: Ronnie Caldwell is a 65 y.o. male who presents today for routine electrophysiology followup.  Since last being seen in our clinic, the patient reports doing very well.  Today, he denies symptoms of palpitations, chest pain, shortness of breath,  lower extremity edema, dizziness, presyncope, or syncope.  The patient is otherwise without complaint today.   Past Medical History:  Diagnosis Date   Arthritis    BPH (benign prostatic hyperplasia)    Callus of foot    Chronic anxiety    Depression    GERD (gastroesophageal reflux disease)    Hx of fracture of clavicle    Hypercholesteremia    Paroxysmal atrial fibrillation (HCC)    PONV (postoperative nausea and vomiting)    30 years ago    Sleep apnea    uses CPAP   TMJ syndrome    Varicose vein of leg    Wenckebach second degree AV block    Past Surgical History:  Procedure Laterality Date   ATRIAL FIBRILLATION ABLATION N/A 07/09/2020   Procedure: ATRIAL FIBRILLATION ABLATION;  Surgeon: Ronnie Grayer, MD;  Location: [place] [facility];  Service: Cardiovascular;  Laterality: N/A;   BUNIONECTOMY     implantable loop recorder placement  03/25/2020   Medtronic Reveal Linq model LNQ 22 (SN [id number]) implantable loop recorder implanted in office by Ronnie Ronnie Caldwell JOINT SURGERY     TOTAL KNEE ARTHROPLASTY Right 06/17/2019   Procedure: TOTAL KNEE ARTHROPLASTY;  Surgeon: Ronnie Huger, MD;  Location: [facility];  Service: Orthopedics;  Laterality: Right;    ROS- all systems are reviewed and negatives except as per HPI above  Current Outpatient Medications  Medication Sig Dispense Refill   b complex vitamins tablet Take 1 tablet by mouth daily.     citalopram (CELEXA) 20 MG tablet Take 20 mg by mouth every morning.     milk thistle 175 MG tablet Take 175 mg by mouth daily.     Multiple Vitamin (MULTIVITAMIN WITH MINERALS) TABS tablet Take 1 tablet by mouth daily.     tadalafil  (CIALIS) 5 MG tablet Take 5 mg by mouth daily as needed for erectile dysfunction.      tamsulosin (FLOMAX) 0.4 MG CAPS capsule Take 0.4 mg by mouth daily.     traZODone (DESYREL) 50 MG tablet Take 50 mg by mouth at bedtime.     No current facility-administered medications for this visit.    Physical Exam: Vitals:   07/26/21 1409  BP: 108/68  Pulse: 70  SpO2: 96%  Weight: 211 lb 12.8 oz (96.1 kg)  Height: '6\' 4"'$  (1.93 m)    GEN- The patient is well appearing, alert and oriented x 3 today.   Head- normocephalic, atraumatic Eyes-  Sclera clear, conjunctiva pink Ears- hearing intact Oropharynx- clear Lungs- Clear to ausculation bilaterally, normal work of breathing Heart- Regular rate and rhythm, no murmurs, rubs or gallops, PMI not laterally displaced GI- soft, NT, ND, + BS Extremities- no clubbing, cyanosis, or edema  Wt Readings from Last 3 Encounters:  07/26/21 211 lb 12.8 oz (96.1 kg)  01/18/21 218 lb (98.9 kg)  10/19/20 208 lb 9.6 oz (94.6 kg)    EKG tracing ordered today is personally reviewed and shows sinus  Assessment and Plan:  Paroxysmal atrial fibrillation/ atrial flutter Well controlled (burden 0% by ILR) Chads2vasc score is 1.  He does not currently require Matthews  2. HL Given calcium  score of 69%, I have previously advised statin which he has declined.  We discussed this again today. He has follow-up with Ronnie Ronnie Caldwell in November and would like to have his lipids checked then No change required today   Return to see EP APP annually   Ronnie Grayer MD, [facility] 07/26/2021 2:17 PM

## 2021-07-26 NOTE — Patient Instructions (Signed)
Medication Instructions:  Your physician recommends that you continue on your current medications as directed. Please refer to the Current Medication list given to you today.  Labwork: None ordered.  Testing/Procedures: None ordered.  Follow-Up: Your physician wants you to follow-up in: 12 months with the [facility]. They will contact you to schedule.    Any Other Special Instructions Will Be Listed Below (If Applicable).  If you need a refill on your cardiac medications before your next appointment, please call your pharmacy.

## 2021-09-15 LAB — CUP PACEART REMOTE DEVICE CHECK
Date Time Interrogation Session: 20221102161019
Implantable Pulse Generator Implant Date: 20210512

## 2021-10-06 DIAGNOSIS — Z0000 Encounter for general adult medical examination without abnormal findings: Secondary | ICD-10-CM | POA: Diagnosis not present

## 2021-10-06 DIAGNOSIS — D509 Iron deficiency anemia, unspecified: Secondary | ICD-10-CM | POA: Diagnosis not present

## 2021-10-06 DIAGNOSIS — I2510 Atherosclerotic heart disease of native coronary artery without angina pectoris: Secondary | ICD-10-CM | POA: Diagnosis not present

## 2021-10-12 DIAGNOSIS — D72819 Decreased white blood cell count, unspecified: Secondary | ICD-10-CM | POA: Diagnosis not present

## 2021-10-12 DIAGNOSIS — R1319 Other dysphagia: Secondary | ICD-10-CM | POA: Diagnosis not present

## 2021-10-12 DIAGNOSIS — G4733 Obstructive sleep apnea (adult) (pediatric): Secondary | ICD-10-CM | POA: Diagnosis not present

## 2021-10-12 DIAGNOSIS — B351 Tinea unguium: Secondary | ICD-10-CM | POA: Diagnosis not present

## 2021-10-12 DIAGNOSIS — I8393 Asymptomatic varicose veins of bilateral lower extremities: Secondary | ICD-10-CM | POA: Diagnosis not present

## 2021-10-12 DIAGNOSIS — Z23 Encounter for immunization: Secondary | ICD-10-CM | POA: Diagnosis not present

## 2021-10-12 DIAGNOSIS — I2510 Atherosclerotic heart disease of native coronary artery without angina pectoris: Secondary | ICD-10-CM | POA: Diagnosis not present

## 2021-10-12 DIAGNOSIS — I480 Paroxysmal atrial fibrillation: Secondary | ICD-10-CM | POA: Diagnosis not present

## 2021-10-12 DIAGNOSIS — S61411A Laceration without foreign body of right hand, initial encounter: Secondary | ICD-10-CM | POA: Diagnosis not present

## 2021-10-12 DIAGNOSIS — R3129 Other microscopic hematuria: Secondary | ICD-10-CM | POA: Diagnosis not present

## 2021-10-12 DIAGNOSIS — Z0000 Encounter for general adult medical examination without abnormal findings: Secondary | ICD-10-CM | POA: Diagnosis not present

## 2021-10-21 LAB — CUP PACEART REMOTE DEVICE CHECK
Date Time Interrogation Session: 20221208085859
Implantable Pulse Generator Implant Date: 20210512

## 2021-10-25 ENCOUNTER — Ambulatory Visit (INDEPENDENT_AMBULATORY_CARE_PROVIDER_SITE_OTHER): Payer: Medicare Other

## 2021-10-25 DIAGNOSIS — I480 Paroxysmal atrial fibrillation: Secondary | ICD-10-CM | POA: Diagnosis not present

## 2021-10-28 DIAGNOSIS — E538 Deficiency of other specified B group vitamins: Secondary | ICD-10-CM | POA: Diagnosis not present

## 2021-11-04 NOTE — Progress Notes (Signed)
Carelink Summary Report / Loop Recorder 

## 2021-11-23 DIAGNOSIS — M21611 Bunion of right foot: Secondary | ICD-10-CM | POA: Diagnosis not present

## 2021-11-23 DIAGNOSIS — M205X1 Other deformities of toe(s) (acquired), right foot: Secondary | ICD-10-CM | POA: Diagnosis not present

## 2021-11-23 DIAGNOSIS — M2041 Other hammer toe(s) (acquired), right foot: Secondary | ICD-10-CM | POA: Diagnosis not present

## 2021-11-23 DIAGNOSIS — B351 Tinea unguium: Secondary | ICD-10-CM | POA: Diagnosis not present

## 2021-11-29 ENCOUNTER — Ambulatory Visit (INDEPENDENT_AMBULATORY_CARE_PROVIDER_SITE_OTHER): Payer: Medicare Other

## 2021-11-29 DIAGNOSIS — I480 Paroxysmal atrial fibrillation: Secondary | ICD-10-CM | POA: Diagnosis not present

## 2021-11-30 LAB — CUP PACEART REMOTE DEVICE CHECK
Date Time Interrogation Session: 20230116125122
Implantable Pulse Generator Implant Date: 20210512

## 2021-12-01 DIAGNOSIS — L603 Nail dystrophy: Secondary | ICD-10-CM | POA: Diagnosis not present

## 2021-12-01 DIAGNOSIS — B351 Tinea unguium: Secondary | ICD-10-CM | POA: Diagnosis not present

## 2021-12-10 NOTE — Progress Notes (Signed)
Carelink Summary Report / Loop Recorder 

## 2021-12-14 DIAGNOSIS — B351 Tinea unguium: Secondary | ICD-10-CM | POA: Diagnosis not present

## 2021-12-14 DIAGNOSIS — M205X1 Other deformities of toe(s) (acquired), right foot: Secondary | ICD-10-CM | POA: Diagnosis not present

## 2021-12-14 DIAGNOSIS — M2041 Other hammer toe(s) (acquired), right foot: Secondary | ICD-10-CM | POA: Diagnosis not present

## 2022-01-03 ENCOUNTER — Ambulatory Visit (INDEPENDENT_AMBULATORY_CARE_PROVIDER_SITE_OTHER): Payer: Medicare Other

## 2022-01-03 DIAGNOSIS — I480 Paroxysmal atrial fibrillation: Secondary | ICD-10-CM | POA: Diagnosis not present

## 2022-01-04 LAB — CUP PACEART REMOTE DEVICE CHECK
Date Time Interrogation Session: 20230220123441
Implantable Pulse Generator Implant Date: 20210512

## 2022-01-10 DIAGNOSIS — R1110 Vomiting, unspecified: Secondary | ICD-10-CM | POA: Diagnosis not present

## 2022-01-10 DIAGNOSIS — I4891 Unspecified atrial fibrillation: Secondary | ICD-10-CM | POA: Diagnosis not present

## 2022-01-10 DIAGNOSIS — Z720 Tobacco use: Secondary | ICD-10-CM | POA: Diagnosis not present

## 2022-01-10 DIAGNOSIS — R1310 Dysphagia, unspecified: Secondary | ICD-10-CM | POA: Diagnosis not present

## 2022-01-10 NOTE — Progress Notes (Signed)
Carelink Summary Report / Loop Recorder 

## 2022-01-13 DIAGNOSIS — K222 Esophageal obstruction: Secondary | ICD-10-CM | POA: Diagnosis not present

## 2022-01-13 DIAGNOSIS — K2100 Gastro-esophageal reflux disease with esophagitis, without bleeding: Secondary | ICD-10-CM | POA: Diagnosis not present

## 2022-01-13 DIAGNOSIS — K449 Diaphragmatic hernia without obstruction or gangrene: Secondary | ICD-10-CM | POA: Diagnosis not present

## 2022-01-13 DIAGNOSIS — K209 Esophagitis, unspecified without bleeding: Secondary | ICD-10-CM | POA: Diagnosis not present

## 2022-01-13 DIAGNOSIS — K2289 Other specified disease of esophagus: Secondary | ICD-10-CM | POA: Diagnosis not present

## 2022-02-02 ENCOUNTER — Ambulatory Visit (INDEPENDENT_AMBULATORY_CARE_PROVIDER_SITE_OTHER): Payer: Medicare Other

## 2022-02-02 ENCOUNTER — Other Ambulatory Visit: Payer: Self-pay

## 2022-02-02 ENCOUNTER — Ambulatory Visit: Payer: Medicare Other | Admitting: Family Medicine

## 2022-02-02 VITALS — BP 110/70 | HR 70 | Ht 76.0 in | Wt 226.0 lb

## 2022-02-02 DIAGNOSIS — M79672 Pain in left foot: Secondary | ICD-10-CM | POA: Diagnosis not present

## 2022-02-02 DIAGNOSIS — M722 Plantar fascial fibromatosis: Secondary | ICD-10-CM | POA: Diagnosis not present

## 2022-02-02 NOTE — Progress Notes (Signed)
? ?  Ronnie Caldwell, am serving as a Education administrator for Dr. Lynne Caldwell. ? ?Subjective:   ? ?CC: L foot pain ? ?HPI: Pt is a 66 y/o male c/o L foot pain x 3 months. Pt locates pain to Left heel. Patient states that he hadn't been walking for about a month or more and when he picked it back up doing his walking routine he started having really bad heel pain in the left foot. Patient states he did a week of resting and decided to go for another walk but that made it worse as well as a slip he had down a step and hurt the left foot that aggravated the heel. Patient saw PCP and was referred to our office.  ? ?L foot swelling: not in heel but top of foot after the slip ?Aggravates: pressure (walking) ?Treatments tried: resting, stretches, warm shower, iced, heel inserts  ? ?Pertinent review of Systems: No fevers or chills ? ?Relevant historical information: Sleep apnea ? ? ?Objective:   ? ?Vitals:  ? 02/02/22 1335  ?BP: 110/70  ?Pulse: 70  ?SpO2: 97%  ? ?General: Well Developed, well nourished, and in no acute distress.  ? ?MSK: Left foot: Slight slight elevated arch otherwise normal. ?Tender palpation plantar calcaneus. ?Normal foot and ankle motion. ?Intact strength. ? ?Lab and Radiology Results ? ?X-ray images left calcaneus obtained today personally independently interpreted ?Small-up plantar calcaneal spur.  No acute fractures are visible. ?Await formal radiology review ? ? ?Impression and Recommendations:   ? ?Assessment and Plan: ?66 y.o. male with chronic left calcaneal pain.  Symptoms ongoing now for greater than 3 months.  He is already pretty well maximized his conservative management with cushioning, ice, massage, eccentric exercises.  We will add night splints and will refer for extracorporeal shockwave therapy.  Check back in 3 months.  If not better consider injection with steroids and possibly PRP in the future.  If still not better MRI.Marland Kitchen ? ?PDMP not reviewed this encounter. ?Orders Placed This Encounter   ?Procedures  ? DG Os Calcis Left  ?  Standing Status:   Future  ?  Number of Occurrences:   1  ?  Standing Expiration Date:   02/03/2023  ?  Order Specific Question:   Reason for Exam (SYMPTOM  OR DIAGNOSIS REQUIRED)  ?  Answer:   Left heel pain  ?  Order Specific Question:   Preferred imaging location?  ?  Answer:   [facility]  ? AMB referral to sports medicine  ?  Referral Priority:   Routine  ?  Referral Type:   Consultation  ?  Referred to Provider:   Rosemarie Ax, MD  ?  Number of Visits Requested:   1  ? ?No orders of the defined types were placed in this encounter. ? ? ?Discussed warning signs or symptoms. Please see discharge instructions. Patient expresses understanding. ? ? ?The above documentation has been reviewed and is accurate and complete Ronnie Caldwell, M.D. ? ?

## 2022-02-02 NOTE — Patient Instructions (Addendum)
Good to see you  ?Night splint on [employer]  ?Referral to Dr. Raeford Razor for shockwave therapy ?X ray on the way out ?Follow up in 6 weeks  ?

## 2022-02-03 ENCOUNTER — Encounter: Payer: Self-pay | Admitting: Family Medicine

## 2022-02-03 ENCOUNTER — Ambulatory Visit (INDEPENDENT_AMBULATORY_CARE_PROVIDER_SITE_OTHER): Payer: Self-pay | Admitting: Family Medicine

## 2022-02-03 VITALS — BP 100/70 | Ht 76.0 in | Wt 226.0 lb

## 2022-02-03 DIAGNOSIS — M722 Plantar fascial fibromatosis: Secondary | ICD-10-CM

## 2022-02-03 NOTE — Assessment & Plan Note (Signed)
Acute on chronic in nature.  ?- completed shockwave therapy  ?

## 2022-02-03 NOTE — Progress Notes (Signed)
?  Ronnie Caldwell - 67 y.o. male MRN [id number]  Date of birth: 1956/09/21 ? ?SUBJECTIVE:  Including CC & ROS.  ?No chief complaint on file. ? ? ?Ronnie Caldwell is a 66 y.o. male that is presenting with a 61-monthhistory of heel pain.  Was found to have Planter fasciitis.  He is here for shockwave therapy. ? ? ?Review of Systems ?See HPI  ? ?HISTORY: Past Medical, Surgical, Social, and Family History Reviewed & Updated per EMR.   ?Pertinent Historical Findings include: ? ?Past Medical History:  ?Diagnosis Date  ? Arthritis   ? BPH (benign prostatic hyperplasia)   ? Callus of foot   ? Chronic anxiety   ? Depression   ? GERD (gastroesophageal reflux disease)   ? Hx of fracture of clavicle   ? Hypercholesteremia   ? Paroxysmal atrial fibrillation (HCC)   ? PONV (postoperative nausea and vomiting)   ? 30 years ago   ? Sleep apnea   ? uses CPAP  ? TMJ syndrome   ? Varicose vein of leg   ? Wenckebach second degree AV block   ? ? ?Past Surgical History:  ?Procedure Laterality Date  ? ATRIAL FIBRILLATION ABLATION N/A 07/09/2020  ? Procedure: ATRIAL FIBRILLATION ABLATION;  Surgeon: AThompson Grayer MD;  [facility];  Service: Cardiovascular;  Laterality: N/A;  ? BUNIONECTOMY    ? implantable loop recorder placement  03/25/2020  ? Medtronic Reveal Linq model LNQ 22 ([id number]) implantable loop recorder implanted in office by Dr ARayann Heman ? TEMPOROMANDIBULAR JOINT SURGERY    ? TOTAL KNEE ARTHROPLASTY Right 06/17/2019  ? Procedure: TOTAL KNEE ARTHROPLASTY;  Surgeon: LVickey Huger MD;  [facility]: [facility];  Service: Orthopedics;  Laterality: Right;  ? ? ? ?PHYSICAL EXAM:  ?VS: BP 100/70 (BP Location: Left Arm, Patient Position: Sitting)   Ht '6\' 4"'$  (1.93 m)   Wt 226 lb (102.5 kg)   BMI 27.51 kg/m?  ?Physical Exam ?Gen: NAD, alert, cooperative with exam, well-appearing ?MSK:  ?Neurovascularly intact   ? ?ECSWT Note ?JIrven Caldwell ?51955-02-23? ?Procedure: ECSWT ?Indications: left heel pain  ? ?Procedure  Details ?Consent: Risks of procedure as well as the alternatives and risks of each were explained to the (patient/caregiver).  Consent for procedure obtained. ?Time Out: Verified patient identification, verified procedure, site/side was marked, verified correct patient position, special equipment/implants available, medications/allergies/relevent history reviewed, required imaging and test results available.  Performed.  The area was cleaned with iodine and alcohol swabs.   ? ?The left heel was targeted for Extracorporeal shockwave therapy.  ? ?Preset: plantar fasciitis  ?Power Level: 40 ?Frequency: 10 ?Impulse/cycles: 2600 ?Head size: medium  ?Session: 1st ? ?Patient did tolerate procedure well. ? ? ? ?ASSESSMENT & PLAN:  ? ?Plantar fasciitis, left ?Acute on chronic in nature.  ?- completed shockwave therapy  ? ? ? ? ?

## 2022-02-07 ENCOUNTER — Ambulatory Visit (INDEPENDENT_AMBULATORY_CARE_PROVIDER_SITE_OTHER): Payer: Medicare Other

## 2022-02-07 DIAGNOSIS — I480 Paroxysmal atrial fibrillation: Secondary | ICD-10-CM | POA: Diagnosis not present

## 2022-02-07 NOTE — Progress Notes (Signed)
X-ray heel bone shows a heel spur and some arthritis changes at the ankle joint.

## 2022-02-08 ENCOUNTER — Encounter: Payer: Self-pay | Admitting: Family Medicine

## 2022-02-08 ENCOUNTER — Ambulatory Visit (INDEPENDENT_AMBULATORY_CARE_PROVIDER_SITE_OTHER): Payer: Self-pay | Admitting: Family Medicine

## 2022-02-08 DIAGNOSIS — M722 Plantar fascial fibromatosis: Secondary | ICD-10-CM

## 2022-02-08 LAB — CUP PACEART REMOTE DEVICE CHECK
Date Time Interrogation Session: 20230328143009
Implantable Pulse Generator Implant Date: 20210512

## 2022-02-08 NOTE — Progress Notes (Signed)
?  Ronnie Caldwell - 66 y.o. male MRN [id number]  Date of birth: 1956-08-20 ? ?SUBJECTIVE:  Including CC & ROS.  ?No chief complaint on file. ? ? ?Ronnie Caldwell is a 66 y.o. male that is here for shockwave therapy. ? ? ? ?Review of Systems ?See HPI  ? ?HISTORY: Past Medical, Surgical, Social, and Family History Reviewed & Updated per EMR.   ?Pertinent Historical Findings include: ? ?Past Medical History:  ?Diagnosis Date  ? Arthritis   ? BPH (benign prostatic hyperplasia)   ? Callus of foot   ? Chronic anxiety   ? Depression   ? GERD (gastroesophageal reflux disease)   ? Hx of fracture of clavicle   ? Hypercholesteremia   ? Paroxysmal atrial fibrillation (HCC)   ? PONV (postoperative nausea and vomiting)   ? 30 years ago   ? Sleep apnea   ? uses CPAP  ? TMJ syndrome   ? Varicose vein of leg   ? Wenckebach second degree AV block   ? ? ?Past Surgical History:  ?Procedure Laterality Date  ? ATRIAL FIBRILLATION ABLATION N/A 07/09/2020  ? Procedure: ATRIAL FIBRILLATION ABLATION;  Surgeon: Thompson Grayer, MD;  Location: [place] [facility];  Service: Cardiovascular;  Laterality: N/A;  ? BUNIONECTOMY    ? implantable loop recorder placement  03/25/2020  ? Medtronic Reveal Linq model LNQ 22 ([place] [id number]) implantable loop recorder implanted in office by Dr Rayann Heman  ? TEMPOROMANDIBULAR JOINT SURGERY    ? TOTAL KNEE ARTHROPLASTY Right 06/17/2019  ? Procedure: TOTAL KNEE ARTHROPLASTY;  Surgeon: Vickey Huger, MD;  [facility]: [facility];  Service: Orthopedics;  Laterality: Right;  ? ? ? ?PHYSICAL EXAM:  ?VS: Ht '6\' 4"'$  (1.93 m)   Wt 226 lb (102.5 kg)   BMI 27.51 kg/m?  ?Physical Exam ?Gen: NAD, alert, cooperative with exam, well-appearing ?MSK:  ?Neurovascularly intact   ? ?ECSWT Note ?Ronnie Caldwell ?1956/06/01 ? ?Procedure: ECSWT ?Indications: left heel pain  ? ?Procedure Details ?Consent: Risks of procedure as well as the alternatives and risks of each were explained to the (patient/caregiver).  Consent for procedure obtained. ?Time  Out: Verified patient identification, verified procedure, site/side was marked, verified correct patient position, special equipment/implants available, medications/allergies/relevent history reviewed, required imaging and test results available.  Performed.  The area was cleaned with iodine and alcohol swabs.   ? ?The left heel was targeted for Extracorporeal shockwave therapy.  ? ?Preset: plantar fasciitis  ?Power Level: 50 ?Frequency: 10 ?Impulse/cycles: 2700 ?Head size: medium  ?Session: 2nd ? ?Patient did tolerate procedure well. ? ? ? ?ASSESSMENT & PLAN:  ? ?Plantar fasciitis, left ?Completed shockwave therapy ? ? ? ? ?

## 2022-02-08 NOTE — Assessment & Plan Note (Signed)
Completed shockwave therapy  

## 2022-02-14 ENCOUNTER — Encounter: Payer: Self-pay | Admitting: Family Medicine

## 2022-02-14 ENCOUNTER — Ambulatory Visit (INDEPENDENT_AMBULATORY_CARE_PROVIDER_SITE_OTHER): Payer: Self-pay | Admitting: Family Medicine

## 2022-02-14 DIAGNOSIS — M722 Plantar fascial fibromatosis: Secondary | ICD-10-CM

## 2022-02-14 NOTE — Progress Notes (Signed)
?  Ronnie Caldwell - 66 y.o. male MRN [id number]  Date of birth: [birth year] ? ?SUBJECTIVE:  Including CC & ROS.  ?No chief complaint on file. ? ? ?[name] is a 66 y.o. male that is here for shockwave therapy. ? ? ?Review of Systems ?See HPI  ? ?HISTORY: Past Medical, Surgical, Social, and Family History Reviewed & Updated per EMR.   ?Pertinent Historical Findings include: ? ?Past Medical History:  ?Diagnosis Date  ? Arthritis   ? BPH (benign prostatic hyperplasia)   ? Callus of foot   ? Chronic anxiety   ? Depression   ? GERD (gastroesophageal reflux disease)   ? Hx of fracture of clavicle   ? Hypercholesteremia   ? Paroxysmal atrial fibrillation (HCC)   ? PONV (postoperative nausea and vomiting)   ? 30 years ago   ? Sleep apnea   ? uses CPAP  ? TMJ syndrome   ? Varicose vein of leg   ? Wenckebach second degree AV block   ? ? ?Past Surgical History:  ?Procedure Laterality Date  ? ATRIAL FIBRILLATION ABLATION N/A 07/09/2020  ? Procedure: ATRIAL FIBRILLATION ABLATION;  Surgeon: Thompson Grayer, MD;  Location: [place] [facility];  Service: Cardiovascular;  Laterality: N/A;  ? BUNIONECTOMY    ? implantable loop recorder placement  03/25/2020  ? Medtronic Reveal Linq model LNQ 22 ([place] [id number]) implantable loop recorder implanted in office by Dr Rayann Heman  ? TEMPOROMANDIBULAR JOINT SURGERY    ? TOTAL KNEE ARTHROPLASTY Right 06/17/2019  ? Procedure: TOTAL KNEE ARTHROPLASTY;  Surgeon: Vickey Huger, MD;  [facility]: [facility];  Service: Orthopedics;  Laterality: Right;  ? ? ? ?PHYSICAL EXAM:  ?VS: Ht '6\' 4"'$  (1.93 m)   Wt 226 lb (102.5 kg)   BMI 27.51 kg/m?  ?Physical Exam ?Gen: NAD, alert, cooperative with exam, well-appearing ?MSK:  ?Neurovascularly intact   ? ?ECSWT Note ?[name] ?09-25-1956 ? ?Procedure: ECSWT ?Indications: left heel pain  ? ?Procedure Details ?Consent: Risks of procedure as well as the alternatives and risks of each were explained to the (patient/caregiver).  Consent for procedure obtained. ?Time Out:  Verified patient identification, verified procedure, site/side was marked, verified correct patient position, special equipment/implants available, medications/allergies/relevent history reviewed, required imaging and test results available.  Performed.  The area was cleaned with iodine and alcohol swabs.   ? ?The left heel was targeted for Extracorporeal shockwave therapy.  ? ?Preset: plantar fasciitis  ?Power Level: 60 ?Frequency: 10 ?Impulse/cycles: 2700 ?Head size: medium  ?Session: 3rd ? ?Patient did tolerate procedure well. ? ? ? ?ASSESSMENT & PLAN:  ? ?Plantar fasciitis, left ?Completed shockwave therapy ? ? ? ? ?

## 2022-02-14 NOTE — Assessment & Plan Note (Signed)
Completed shockwave therapy  

## 2022-02-17 NOTE — Progress Notes (Signed)
Carelink Summary Report / Loop Recorder 

## 2022-02-21 ENCOUNTER — Ambulatory Visit (INDEPENDENT_AMBULATORY_CARE_PROVIDER_SITE_OTHER): Payer: Self-pay | Admitting: Family Medicine

## 2022-02-21 ENCOUNTER — Encounter: Payer: Self-pay | Admitting: Family Medicine

## 2022-02-21 DIAGNOSIS — M722 Plantar fascial fibromatosis: Secondary | ICD-10-CM

## 2022-02-21 NOTE — Progress Notes (Signed)
?  Ronnie Caldwell - 66 y.o. male MRN [id number]  Date of birth: [birth year] ? ?SUBJECTIVE:  Including CC & ROS.  ?No chief complaint on file. ? ? ?Ronnie Caldwell is a 66 y.o. male that is  here for shockwave therapy. ? ? ? ?Review of Systems ?See HPI  ? ?HISTORY: Past Medical, Surgical, Social, and Family History Reviewed & Updated per EMR.   ?Pertinent Historical Findings include: ? ?Past Medical History:  ?Diagnosis Date  ? Arthritis   ? BPH (benign prostatic hyperplasia)   ? Callus of foot   ? Chronic anxiety   ? Depression   ? GERD (gastroesophageal reflux disease)   ? Hx of fracture of clavicle   ? Hypercholesteremia   ? Paroxysmal atrial fibrillation (HCC)   ? PONV (postoperative nausea and vomiting)   ? 30 years ago   ? Sleep apnea   ? uses CPAP  ? TMJ syndrome   ? Varicose vein of leg   ? Wenckebach second degree AV block   ? ? ?Past Surgical History:  ?Procedure Laterality Date  ? ATRIAL FIBRILLATION ABLATION N/A 07/09/2020  ? Procedure: ATRIAL FIBRILLATION ABLATION;  Surgeon: Thompson Grayer, MD;  Location: [place] [facility];  Service: Cardiovascular;  Laterality: N/A;  ? BUNIONECTOMY    ? implantable loop recorder placement  03/25/2020  ? Medtronic Reveal Linq model LNQ 22 ([place] [id number]) implantable loop recorder implanted in office by Dr Rayann Heman  ? TEMPOROMANDIBULAR JOINT SURGERY    ? TOTAL KNEE ARTHROPLASTY Right 06/17/2019  ? Procedure: TOTAL KNEE ARTHROPLASTY;  Surgeon: Vickey Huger, MD;  [facility]: [facility];  Service: Orthopedics;  Laterality: Right;  ? ? ? ?PHYSICAL EXAM:  ?VS: Ht '6\' 4"'$  (1.93 m)   Wt 226 lb (102.5 kg)   BMI 27.51 kg/m?  ?Physical Exam ?Gen: NAD, alert, cooperative with exam, well-appearing ?MSK:  ?Neurovascularly intact   ? ?ECSWT Note ?Ronnie Caldwell ?1956-10-25 ? ?Procedure: ECSWT ?Indications: left heel pain ? ?Procedure Details ?Consent: Risks of procedure as well as the alternatives and risks of each were explained to the (patient/caregiver).  Consent for procedure obtained. ?Time  Out: Verified patient identification, verified procedure, site/side was marked, verified correct patient position, special equipment/implants available, medications/allergies/relevent history reviewed, required imaging and test results available.  Performed.  The area was cleaned with iodine and alcohol swabs.   ? ?The left heel was targeted for Extracorporeal shockwave therapy.  ? ?Preset: plantar fasciitis  ?Power Level: 80 ?Frequency: 10 ?Impulse/cycles: 2900 ?Head size: medium  ?Session: 4th ? ?Patient did tolerate procedure well. ? ? ? ?ASSESSMENT & PLAN:  ? ?Plantar fasciitis, left ?Completed shockwave therapy ? ? ? ? ?

## 2022-02-21 NOTE — Assessment & Plan Note (Signed)
Completed shockwave therapy  

## 2022-03-01 ENCOUNTER — Ambulatory Visit: Payer: Medicare Other | Admitting: Family Medicine

## 2022-03-14 ENCOUNTER — Ambulatory Visit (INDEPENDENT_AMBULATORY_CARE_PROVIDER_SITE_OTHER): Payer: Medicare Other

## 2022-03-14 DIAGNOSIS — I480 Paroxysmal atrial fibrillation: Secondary | ICD-10-CM

## 2022-03-15 LAB — CUP PACEART REMOTE DEVICE CHECK
Date Time Interrogation Session: 20230502084135
Implantable Pulse Generator Implant Date: 20210512

## 2022-03-16 ENCOUNTER — Ambulatory Visit: Payer: Medicare Other | Admitting: Family Medicine

## 2022-03-16 ENCOUNTER — Ambulatory Visit (INDEPENDENT_AMBULATORY_CARE_PROVIDER_SITE_OTHER): Payer: Medicare Other

## 2022-03-16 VITALS — BP 132/80 | HR 67 | Ht 76.0 in | Wt 229.6 lb

## 2022-03-16 DIAGNOSIS — M79672 Pain in left foot: Secondary | ICD-10-CM | POA: Diagnosis not present

## 2022-03-16 DIAGNOSIS — M19072 Primary osteoarthritis, left ankle and foot: Secondary | ICD-10-CM | POA: Diagnosis not present

## 2022-03-16 DIAGNOSIS — M5416 Radiculopathy, lumbar region: Secondary | ICD-10-CM | POA: Diagnosis not present

## 2022-03-16 NOTE — Patient Instructions (Addendum)
Thank you for coming in today.  ? ?Please get an Xray today before you leave  ? ?Please go to [facility] supply to get the Post-op Shoe we talked about today. You may also be able to get it from [employer].   ? ?Send me an update in 2 weeks if not better and will likely proceed to MRI ?

## 2022-03-16 NOTE — Progress Notes (Signed)
? ?  I, Peterson Lombard, LAT, ATC acting as a scribe for Lynne Leader, MD. ? ?Ronnie Caldwell is a 66 y.o. male who presents to [place] [facility] today for f/u L calcaneal pain. Pt was last seen by Dr. Georgina Snell on 02/02/22 and was advised to use night splints and will refer for extracorporeal shockwave therapy, completing 4 treatments. Today, pt reports L heel pain is somewhat better, 80% improvement. He notes the L heel will start hurting more w/ increased activity, such a yard work, that he will have to stop and ice. Pt now c/o swelling and pain along the dorsum of the L foot.  ? ?Pt returned after XR with some additional questions; would orthotics possibly be help? ?Pt is having a "weird" "warm" sensation along the lateral aspect of the L lower leg that is very intermittent and now more frequent. This has been doing on for over a year. ? ?Dx imaging: 02/02/22 L Os calcis XR ? ?Pertinent review of systems: No fevers or chills ? ?Relevant historical information: Paroxysmal atrial fibrillation. ? ? ?Exam:  ?BP 132/80   Pulse 67   Ht '6\' 4"'$  (1.93 m)   Wt 229 lb 9.6 oz (104.1 kg)   SpO2 97%   BMI 27.95 kg/m?  ?General: Well Developed, well nourished, and in no acute distress.  ? ?MSK: Left foot swelling at dorsal lateral midfoot. ?Otherwise relatively normal. ?Tender palpation dorsal lateral midfoot and at third metatarsal shaft dorsally. ?Normal foot and ankle motion. ?Pulses capillary refill and sensation are intact distally. ? ? ? ?Lab and Radiology Results ? ?X-ray images left foot obtained today personally and independently interpreted ?No acute fractures.  Mild midfoot DJD. ?Await formal radiology review ? ? ?Assessment and Plan: ?66 y.o. male with  ?Left midfoot and forefoot pain and swelling.  The forefoot pain is concerning for stress fracture or stress reaction.   ? ?The midfoot pain is concern for stress reaction or stress fracture of the cuboid.  Plan for postop shoe and if not  improving in the next week or 2 we will proceed to advanced imaging typically MRI. ? ?At the end of the visit he also brought up some paresthesia type symptoms predominantly rating down his left leg in an L5 dermatomal pattern.  This is very likely lumbar radiculopathy but his symptoms are not bad enough at this time to aggressively treat.  Watchful waiting. ? ? ?PDMP not reviewed this encounter. ?Orders Placed This Encounter  ?Procedures  ? DG Foot Complete Left  ?  Standing Status:   Future  ?  Number of Occurrences:   1  ?  Standing Expiration Date:   03/17/2023  ?  Order Specific Question:   Reason for Exam (SYMPTOM  OR DIAGNOSIS REQUIRED)  ?  Answer:   left foot pain  ?  Order Specific Question:   Preferred imaging location?  ?  Answer:   [facility]  ? ?No orders of the defined types were placed in this encounter. ? ? ? ?Discussed warning signs or symptoms. Please see discharge instructions. Patient expresses understanding. ? ? ?The above documentation has been reviewed and is accurate and complete Lynne Leader, M.D. ? ? ?

## 2022-03-17 NOTE — Progress Notes (Signed)
X-ray does show some arthritis changes at the big toe and an old injury at the outside part of the ankle but no new fractures or new obvious injury.  If not improving soon as we discussed likely proceed to MRI.

## 2022-03-29 NOTE — Progress Notes (Signed)
Carelink Summary Report / Loop Recorder 

## 2022-04-15 ENCOUNTER — Other Ambulatory Visit: Payer: Self-pay

## 2022-04-15 ENCOUNTER — Telehealth: Payer: Self-pay | Admitting: Family Medicine

## 2022-04-15 DIAGNOSIS — M79672 Pain in left foot: Secondary | ICD-10-CM

## 2022-04-15 NOTE — Telephone Encounter (Signed)
Plan ankle MRI to look at the midfoot include cuboid

## 2022-04-15 NOTE — Telephone Encounter (Signed)
Patient called stating that he has not had much improvement in his left foot and would like to continue with the MRI.  Please advise.

## 2022-04-18 ENCOUNTER — Ambulatory Visit (INDEPENDENT_AMBULATORY_CARE_PROVIDER_SITE_OTHER): Payer: Medicare Other

## 2022-04-18 DIAGNOSIS — I480 Paroxysmal atrial fibrillation: Secondary | ICD-10-CM

## 2022-04-19 LAB — CUP PACEART REMOTE DEVICE CHECK
Date Time Interrogation Session: 20230606093720
Implantable Pulse Generator Implant Date: 20210512

## 2022-04-23 ENCOUNTER — Ambulatory Visit (INDEPENDENT_AMBULATORY_CARE_PROVIDER_SITE_OTHER): Payer: Medicare Other

## 2022-04-23 DIAGNOSIS — M722 Plantar fascial fibromatosis: Secondary | ICD-10-CM | POA: Diagnosis not present

## 2022-04-23 DIAGNOSIS — M25472 Effusion, left ankle: Secondary | ICD-10-CM

## 2022-04-23 DIAGNOSIS — M25572 Pain in left ankle and joints of left foot: Secondary | ICD-10-CM | POA: Diagnosis not present

## 2022-04-23 DIAGNOSIS — R600 Localized edema: Secondary | ICD-10-CM | POA: Diagnosis not present

## 2022-04-23 DIAGNOSIS — M79672 Pain in left foot: Secondary | ICD-10-CM

## 2022-04-23 DIAGNOSIS — M7989 Other specified soft tissue disorders: Secondary | ICD-10-CM | POA: Diagnosis not present

## 2022-04-26 NOTE — Progress Notes (Signed)
MRI of the foot she has severe plantar fasciitis and it shows severe bone edema and bone bruising of some of the midfoot bones.  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  Recommend return to clinic to go over results in full detail.

## 2022-05-05 NOTE — Progress Notes (Signed)
Carelink Summary Report / Loop Recorder 

## 2022-05-22 LAB — CUP PACEART REMOTE DEVICE CHECK
Date Time Interrogation Session: 20230705143440
Implantable Pulse Generator Implant Date: 20210512

## 2022-05-23 ENCOUNTER — Ambulatory Visit (INDEPENDENT_AMBULATORY_CARE_PROVIDER_SITE_OTHER): Payer: Medicare Other

## 2022-05-23 DIAGNOSIS — I480 Paroxysmal atrial fibrillation: Secondary | ICD-10-CM | POA: Diagnosis not present

## 2022-06-21 NOTE — Progress Notes (Signed)
Carelink Summary Report / Loop Recorder 

## 2022-06-27 ENCOUNTER — Ambulatory Visit: Payer: Medicare Other | Admitting: Family Medicine

## 2022-06-27 ENCOUNTER — Ambulatory Visit: Payer: Medicare Other

## 2022-06-27 VITALS — BP 106/60 | HR 65 | Ht 76.0 in | Wt 220.0 lb

## 2022-06-27 DIAGNOSIS — M79672 Pain in left foot: Secondary | ICD-10-CM | POA: Diagnosis not present

## 2022-06-27 DIAGNOSIS — R202 Paresthesia of skin: Secondary | ICD-10-CM

## 2022-06-27 NOTE — Patient Instructions (Addendum)
Good to see you   Try good arch support insoles. Fleet feet.   Follow up in 1 month. If not improving we can do injection.   I recommend you obtained a compression sleeve to help with your joint problems. There are many options on the market however I recommend obtaining a full ankle Body Helix compression sleeve.  You can find information (including how to appropriate measure yourself for sizing) can be found at www.Body http://www.lambert.com/.  Many of these products are health savings account (HSA) eligible.   You can use the compression sleeve at any time throughout the day but is most important to use while being active as well as for 2 hours post-activity.   It is appropriate to ice following activity with the compression sleeve in place.

## 2022-06-27 NOTE — Progress Notes (Signed)
Subjective:   I, Ronnie Caldwell, am serving as a scribe for Dr. Lynne Leader.   CC: follow up left foot pain   HPI: Ronnie Caldwell is a 66 y.o. male who presents to [place] at [facility] today for f/u L calcaneal pain. Patient was last seen 03/16/22 and was advised to get a postop shoe and if not improving will proceed to advanced imaging. Today patient reports that if he gets active the foot gets swollen, but just walking around he is fine. Went on a hike and the foot was pretty bad.   Positive for paresthesias bilateral lower extremities extending from the plantar aspect feet bilaterally to the dorsal foot and ankle.  Past medical history significant for fibrillation.  Review of Systems: No fevers or chills  Objective:    Vitals:   06/27/22 1244  BP: 106/60  Pulse: 65  SpO2: 97%   General: Well Developed, well nourished, and in no acute distress.  MSK: Left foot: Swelling along dorsal midfoot.  Tender palpation dorsal midfoot. With standing pronation is present.  L-spine: Nontender midline.  Normal lumbar motion.  Lower extremity strength and reflexes are intact.  Lab and Radiology Results  EXAM: MRI OF THE LEFT ANKLE WITHOUT CONTRAST   TECHNIQUE: Multiplanar, multisequence MR imaging of the ankle was performed. No intravenous contrast was administered.   COMPARISON:  None Available.   FINDINGS: TENDONS   Peroneal: Peroneal longus tendon intact. Peroneal brevis intact.   Posteromedial: Posterior tibial tendon intact. Flexor hallucis longus tendon intact. Flexor digitorum longus tendon intact.   Anterior: Tibialis anterior tendon intact. Extensor hallucis longus tendon intact Extensor digitorum longus tendon intact.   Achilles:  Intact.   Plantar Fascia: Severe thickening of the medial band of the plantar fascia with subcortical marrow edema at the calcaneal insertion consistent with severe plantar fasciitis without a tear.   LIGAMENTS    Lateral: Anterior talofibular ligament intact. Calcaneofibular ligament intact. Posterior talofibular ligament intact. Anterior and posterior tibiofibular ligaments intact.   Medial: Deltoid ligament intact. Spring ligament intact.   Lisfranc: Increased signal in the Lisfranc ligament concerning for ligament strain without complete tear.   CARTILAGE   Ankle Joint: No joint effusion. Normal ankle mortise. 5 mm focal area of high-grade partial-thickness cartilage loss involving the medial corner of the talar with mild subchondral reactive marrow changes.   Subtalar Joints/Sinus Tarsi: Normal subtalar joints. No subtalar joint effusion. Normal sinus tarsi.   Bones: No aggressive osseous lesion. No fracture or dislocation. Severe bone marrow edema throughout the middle cuneiform and to lesser extent lateral cuneiform. Bone marrow edema along the plantar lateral aspect the calcaneus, distal lateral corner of the cuboid, and to a much lesser extent base of the second and third metatarsals.   Soft Tissue: No fluid collection or hematoma. Generalized muscle atrophy. Tarsal tunnel is normal. Generalized soft tissue edema involving the entirety of the foot.   IMPRESSION: 1. Severe plantar fasciitis of the medial band of the plantar fascia with subcortical marrow edema. 2. Severe bone marrow edema throughout the middle cuneiform and to a much lesser extent lateral cuneiform. Bone marrow edema along the plantar lateral aspect the calcaneus, distal lateral corner of the cuboid, and to a much lesser extent base of the second and third metatarsals. Overall findings are concerning for osseous contusions. Strain of the Lisfranc ligament without complete tear. If there is further clinical concern regarding a subtle fracture in the region of the Lisfranc joint, recommend a  CT of the foot. 3. A 5 mm focal area of high-grade partial-thickness cartilage loss involving the medial corner of the  talar with mild subchondral reactive marrow changes.     Electronically Signed   By: Kathreen Devoid M.D.   On: 04/26/2022 07:05 I, Lynne Leader, personally (independently) visualized and performed the interpretation of the images attached in this note.   Impression and Recommendations:    Assessment and Plan: 66 y.o. male with dorsal midfoot swelling and some pain.  This is thought to be due to the bone edema seen on prior MRI from April 23, 2022.  That swelling was thought to be due to an injury that he sustained.  However he really ought to be better by now.  I think one of the underlying driving factors here is his foot pronation.  The pronation could be excessively loading the lateral foot structures including the midfoot which is impacting his ability to fully heal.  Plan to help correct foot pronation with good arch support.  He can get good arch support at [employer] with good insoles.  Additionally will try compression ankle and foot sleeve. Check back in a month.  If not better consider injection.  We may also consider custom orthotics..   The bilateral foot paresthesias are most consistent with peripheral neuropathy.  However lumbar radiculopathy is a possibility.  We discussed options.  I would like to proceed with a nerve conduction study.  He is can to play golf with a friend of his who is a neurologist in about 2 weeks and will discuss the matter then.  Will defer decision-making until after that golf outing.   Discussed warning signs or symptoms. Please see discharge instructions. Patient expresses understanding.   The above documentation has been reviewed and is accurate and complete Lynne Leader, M.D.

## 2022-06-29 LAB — CUP PACEART REMOTE DEVICE CHECK
Date Time Interrogation Session: 20230815150435
Implantable Pulse Generator Implant Date: 20210512

## 2022-07-07 ENCOUNTER — Encounter: Payer: Self-pay | Admitting: Internal Medicine

## 2022-07-15 IMAGING — MR MR ANKLE*L* W/O CM
5 series · 40 of 40 positions shown · non-contrast
Comparison: None Available.

CLINICAL DATA: Left ankle pain and swelling for 4 months

EXAM:
MRI OF THE LEFT ANKLE WITHOUT CONTRAST
TECHNIQUE: Multiplanar, multisequence MR imaging of the ankle was performed. No
intravenous contrast was administered.

[Series 3: T2 fat-sat · axial · 3.0mm · 0.90mm/px · z∈[-124,+20]mm · 8 of 45 slices shown (1 of 2)]
[im 1/45]
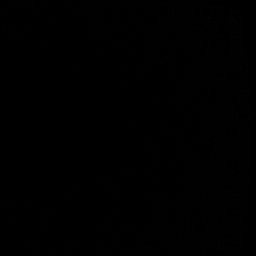
[im 7/45]
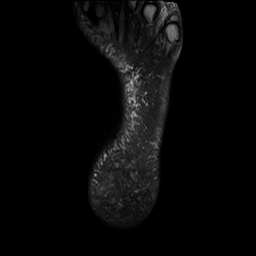
[im 13/45]
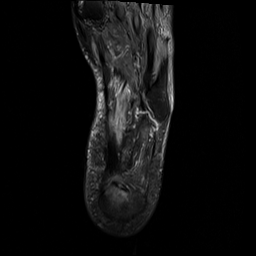
[im 19/45]
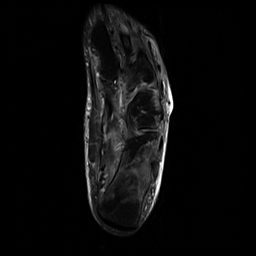
[im 26/45]
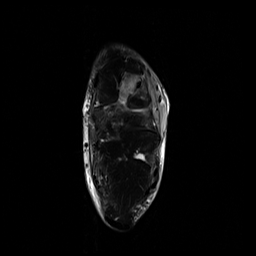
[im 32/45]
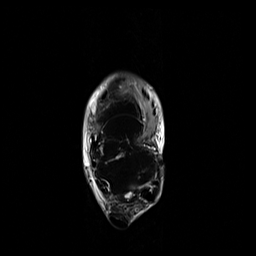
[im 38/45]
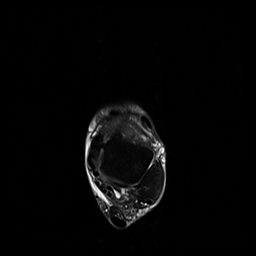
[im 45/45]
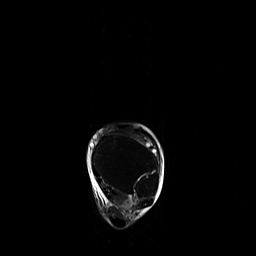

[Series 4: T2 fat-sat · coronal · 3.0mm · 0.70mm/px · 11 of 55 slices shown (2 of 2)]
[im 1/55]
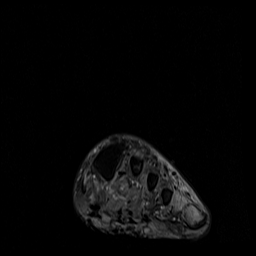
[im 6/55]
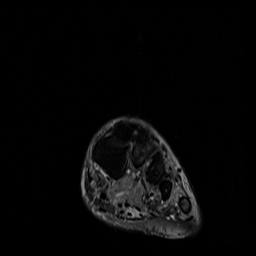
[im 11/55]
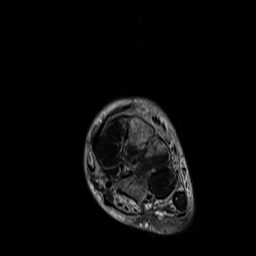
[im 17/55]
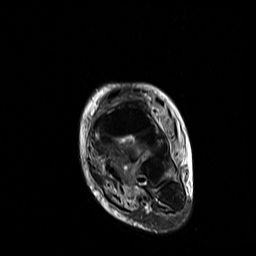
[im 22/55]
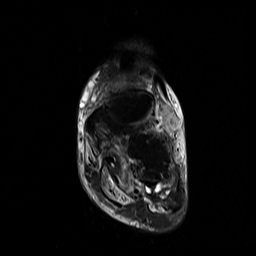
[im 28/55]
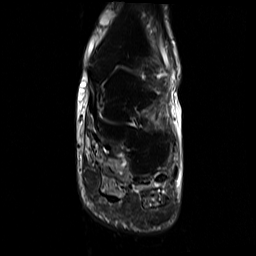
[im 33/55]
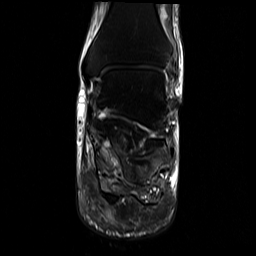
[im 38/55]
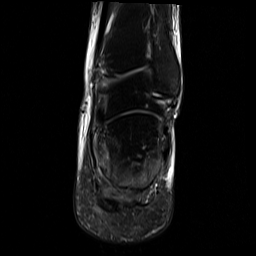
[im 44/55]
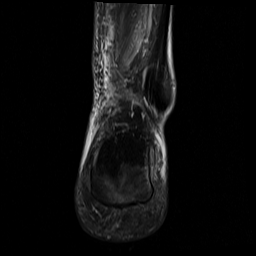
[im 49/55]
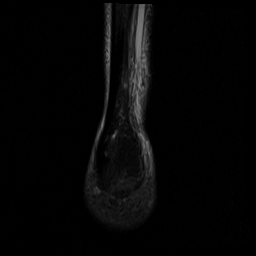
[im 55/55]
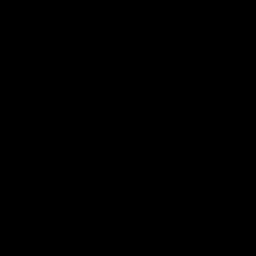

[Series 5: PD fat-sat · axial · 3.0mm · 0.90mm/px · z∈[-124,+20]mm · 9 of 45 slices shown]
[im 1/45]
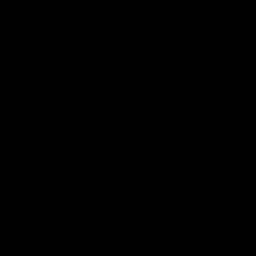
[im 6/45]
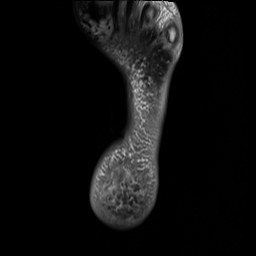
[im 12/45]
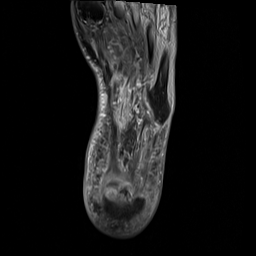
[im 17/45]
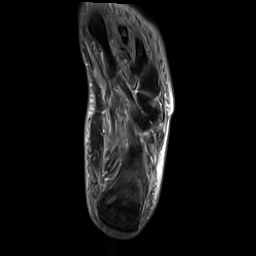
[im 23/45]
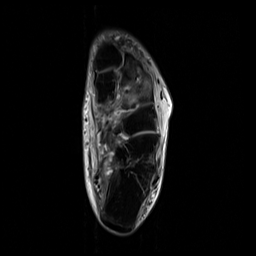
[im 28/45]
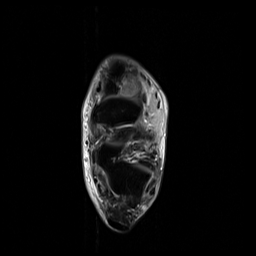
[im 34/45]
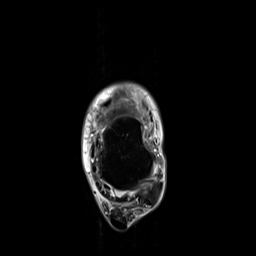
[im 39/45]
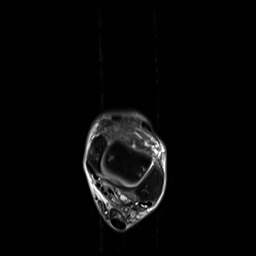
[im 45/45]
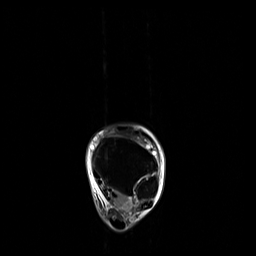

[Series 6: T1 · sagittal · 3.0mm · 0.69mm/px · 6 of 31 slices shown]
[im 1/31]
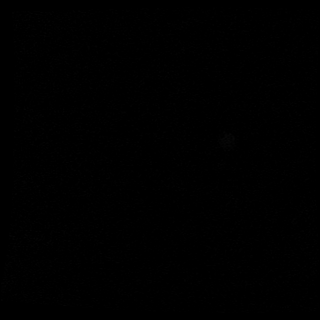
[im 7/31]
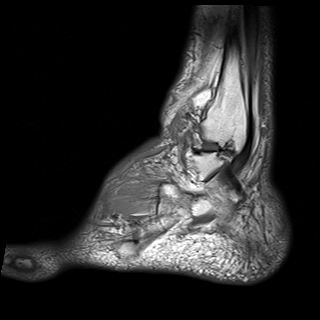
[im 13/31]
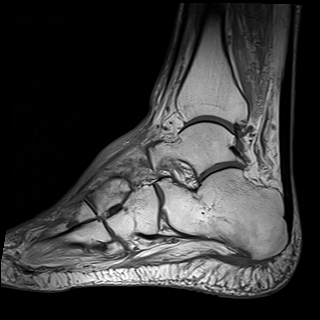
[im 19/31]
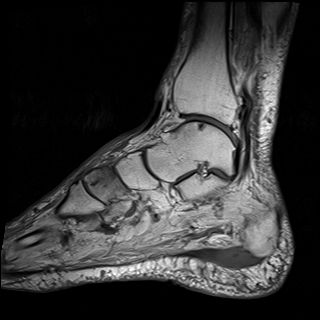
[im 25/31]
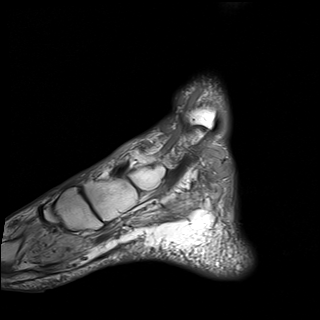
[im 31/31]
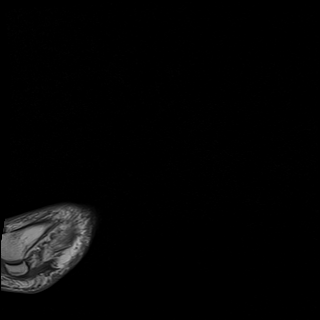

[Series 7: STIR · sagittal · 3.0mm · 0.86mm/px · 6 of 31 slices shown]
[im 1/31]
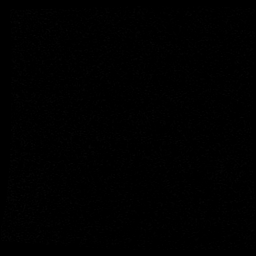
[im 7/31]
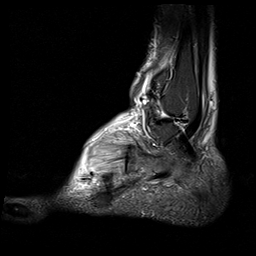
[im 13/31]
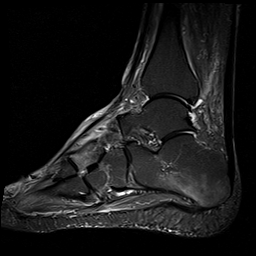
[im 19/31]
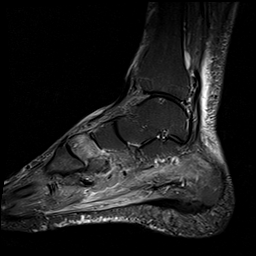
[im 25/31]
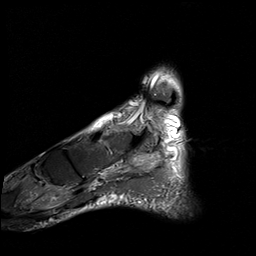
[im 31/31]
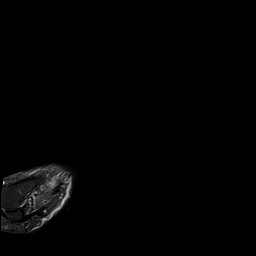

[40 of 40 positions shown; findings below may reference images not displayed]

FINDINGS: TENDONS

Peroneal: Peroneal longus tendon intact. Peroneal brevis intact.

Posteromedial: Posterior tibial tendon intact. Flexor hallucis
longus tendon intact. Flexor digitorum longus tendon intact.

Anterior: Tibialis anterior tendon intact. Extensor hallucis longus
tendon intact Extensor digitorum longus tendon intact.

Achilles:  Intact.

Plantar Fascia: Severe thickening of the medial band of the plantar
fascia with subcortical marrow edema at the calcaneal insertion
consistent with severe plantar fasciitis without a tear.

LIGAMENTS

Lateral: Anterior talofibular ligament intact. Calcaneofibular
ligament intact. Posterior talofibular ligament intact. Anterior and
posterior tibiofibular ligaments intact.

Medial: Deltoid ligament intact. Spring ligament intact.

Lisfranc: Increased signal in the Lisfranc ligament concerning for
ligament strain without complete tear.

CARTILAGE

Ankle Joint: No joint effusion. Normal ankle mortise. 5 mm focal
area of high-grade partial-thickness cartilage loss involving the
medial corner of the talar with mild subchondral reactive marrow
changes.

Subtalar Joints/Sinus Tarsi: Normal subtalar joints. No subtalar
joint effusion. Normal sinus tarsi.

Bones: No aggressive osseous lesion. No fracture or dislocation.
Severe bone marrow edema throughout the middle cuneiform and to
lesser extent lateral cuneiform. Bone marrow edema along the plantar
lateral aspect the calcaneus, distal lateral corner of the cuboid,
and to a much lesser extent base of the second and third
metatarsals.

Soft Tissue: No fluid collection or hematoma. Generalized muscle
atrophy. Tarsal tunnel is normal. Generalized soft tissue edema
involving the entirety of the foot.
IMPRESSION: 1. Severe plantar fasciitis of the medial band of the plantar fascia
with subcortical marrow edema.
2. Severe bone marrow edema throughout the middle cuneiform and to a
much lesser extent lateral cuneiform. Bone marrow edema along the
plantar lateral aspect the calcaneus, distal lateral corner of the
cuboid, and to a much lesser extent base of the second and third
metatarsals. Overall findings are concerning for osseous contusions.
Strain of the Lisfranc ligament without complete tear. If there is
further clinical concern regarding a subtle fracture in the region
of the Lisfranc joint, recommend a CT of the foot.
3. A 5 mm focal area of high-grade partial-thickness cartilage loss
involving the medial corner of the talar with mild subchondral
reactive marrow changes.

## 2022-07-20 ENCOUNTER — Encounter: Payer: Self-pay | Admitting: Family Medicine

## 2022-07-26 ENCOUNTER — Ambulatory Visit: Payer: Medicare Other | Admitting: Family Medicine

## 2022-08-01 ENCOUNTER — Ambulatory Visit (INDEPENDENT_AMBULATORY_CARE_PROVIDER_SITE_OTHER): Payer: Medicare Other

## 2022-08-01 DIAGNOSIS — I480 Paroxysmal atrial fibrillation: Secondary | ICD-10-CM

## 2022-08-04 LAB — CUP PACEART REMOTE DEVICE CHECK
Date Time Interrogation Session: 20230918080118
Implantable Pulse Generator Implant Date: 20210512

## 2022-08-15 NOTE — Progress Notes (Signed)
Carelink Summary Report / Loop Recorder 

## 2022-09-13 ENCOUNTER — Encounter: Payer: Self-pay | Admitting: Internal Medicine

## 2022-09-13 ENCOUNTER — Ambulatory Visit: Payer: Medicare Other | Attending: Internal Medicine | Admitting: Internal Medicine

## 2022-09-13 VITALS — BP 118/70 | HR 63 | Ht 76.0 in | Wt 228.2 lb

## 2022-09-13 DIAGNOSIS — I480 Paroxysmal atrial fibrillation: Secondary | ICD-10-CM

## 2022-09-13 DIAGNOSIS — R0609 Other forms of dyspnea: Secondary | ICD-10-CM

## 2022-09-13 DIAGNOSIS — I4892 Unspecified atrial flutter: Secondary | ICD-10-CM | POA: Diagnosis not present

## 2022-09-13 NOTE — Patient Instructions (Addendum)
Medication Instructions:  Your physician recommends that you continue on your current medications as directed. Please refer to the Current Medication list given to you today.  *If you need a refill on your cardiac medications before your next appointment, please call your pharmacy*  Lab Work: None ordered.  If you have labs (blood work) drawn today and your tests are completely normal, you will receive your results only by: [place] (if you have MyChart) OR A paper copy in the mail If you have any lab test that is abnormal or we need to change your treatment, we will call you to review the results.  Testing/Procedures: Please schedule this patient for a GXT, walking only, Stress test per Dr. Lovena Le.    Follow-Up: At [facility], you and your health needs are our priority.  As part of our continuing mission to provide you with exceptional heart care, we have created designated Provider Care Teams.  These Care Teams include your primary Cardiologist (physician) and Advanced Practice Providers (APPs -  Physician Assistants and Nurse Practitioners) who all work together to provide you with the care you need, when you need it.  We recommend signing up for the patient portal called "MyChart".  Sign up information is provided on this After Visit Summary.  MyChart is used to connect with patients for Virtual Visits (Telemedicine).  Patients are able to view lab/test results, encounter notes, upcoming appointments, etc.  Non-urgent messages can be sent to your provider as well.   To learn more about what you can do with MyChart, go to [email or web address].    Your next appointment:   Follow up when GXT results are available, per Dr. Lovena Le.    The format for your next appointment:   In Person  Provider:   Cristopher Peru, MD{or one of the following Advanced Practice Providers on your designated Care Team:   Tommye Standard, [place] Legrand Como "Jonni Sanger" Chalmers Cater, [place]  Cardiopulmonary Exercise  Stress Test Cardiopulmonary exercise testing (CPET) is a test that checks how the heart and lungs react to exercise. This is called your exercise capacity. During this test, you will walk or run on a treadmill or pedal on a stationary bike. As you walk, run, or pedal, tests will be done on your heart and lungs. You may have this test to: Find out why you are short of breath. Check if you have a heart or lung problem. Check for exercise intolerance. See how your lungs work. See how your heart works. Check whether your heart or lungs are responding to treatments. Tell your doctor about: Any allergies you have. All medicines you are taking. Any problems you or family members have had with anesthetic medicines. Any bleeding problems you have. Any surgeries you have had. Any medical conditions you have. Whether you are pregnant or may be pregnant. What are the risks? Generally, this is a safe test. However, problems may occur, including: Chest pain. Shortness of breath. Dizziness. Leg pain. Irregular heartbeat. What happens before the test? Ask your doctor about changing or stopping your normal medicines. Follow instructions from your doctor about what you cannot eat or drink. Wear loose-fitting clothing and comfortable shoes. If you use an inhaler, bring it with you to the test. What happens during the test?  A blood pressure cuff will be placed on your arm. Patches (electrodes) will be placed on your chest. They will be attached to an EKG machine. A clip-on monitor that shows the amount of oxygen in your blood will be  placed on your finger (pulse oximeter). A clip will be placed on your nose and a mouthpiece will be placed in your mouth. This may be held in place with a headpiece. You will breathe through the mouthpiece during the test. You will be asked to start exercising. You will be closely watched while you exercise. The amount of effort for your exercise will be slowly  increased. During exercise, the test will measure: Your heart rate. Your heart rhythm. Your blood oxygen level. The amount of oxygen that you breathe in and the amount of carbon dioxide that you breathe out. The test will end when: You have finished the test. You have exercised as much as you are able. You have chest or leg pain, dizziness, or shortness of breath. The test may vary among doctors and hospitals. What can I expect after the test? You will be monitored until you leave the hospital or clinic. This includes checking your blood pressure, heart rate, breathing rate, and blood oxygen level. It is up to you to get the results of your test. Ask how to get your results when they are ready. Follow instructions about what to do after the test. Summary Cardiopulmonary exercise testing (CPET) is a test that checks how your heart and lungs react to exercise. Follow your doctor's instructions about food and drink, and what medicines to change or stop. During this test, you will walk or run on a treadmill or pedal on a stationary bike. Tests will be done as you walk, run, or pedal. This information is not intended to replace advice given to you by your health care provider. Make sure you discuss any questions you have with your health care provider. Document Revised: 09/14/2021 Document Reviewed: 09/14/2021 Elsevier Patient Education  [place].

## 2022-09-13 NOTE — Progress Notes (Signed)
HPI Mr. Ronnie Caldwell is referred for ongoing evaluation of PAF, s/p RFA, CAD, and worsening dyspnea. He denies chest pain. He had a loop placed 2 years ago after an afib ablation. He has not had atrial fib and has remained off of anti-coagulation. He notes worsening dyspnea on exertion. He has had 2 of his friends recently have heart attacks and is worried that this could happen to him. He notes that he recently climbed [facility]. He had 40-50% stenosis of the LAD and RCA by CT scan 2 years ago. He has not had syncope and denies palpitations.  No Known Allergies   Current Outpatient Medications  Medication Sig Dispense Refill   b complex vitamins tablet Take 1 tablet by mouth daily.     citalopram (CELEXA) 20 MG tablet Take 20 mg by mouth every morning.     milk thistle 175 MG tablet Take 175 mg by mouth daily.     Multiple Vitamin (MULTIVITAMIN WITH MINERALS) TABS tablet Take 1 tablet by mouth daily.     omeprazole (PRILOSEC) 40 MG capsule Take 40 mg by mouth daily.     rosuvastatin (CRESTOR) 10 MG tablet Take 10 mg by mouth daily.     tadalafil (CIALIS) 5 MG tablet Take 5 mg by mouth daily as needed for erectile dysfunction.      tamsulosin (FLOMAX) 0.4 MG CAPS capsule Take 0.4 mg by mouth daily.     traZODone (DESYREL) 50 MG tablet Take 50 mg by mouth at bedtime.     No current facility-administered medications for this visit.     Past Medical History:  Diagnosis Date   Arthritis    BPH (benign prostatic hyperplasia)    Callus of foot    Chronic anxiety    Depression    GERD (gastroesophageal reflux disease)    Hx of fracture of clavicle    Hypercholesteremia    Paroxysmal atrial fibrillation (HCC)    PONV (postoperative nausea and vomiting)    30 years ago    Sleep apnea    uses CPAP   TMJ syndrome    Varicose vein of leg    Wenckebach second degree AV block     ROS:   All systems reviewed and negative except as noted in the HPI.   Past Surgical  History:  Procedure Laterality Date   ATRIAL FIBRILLATION ABLATION N/A 07/09/2020   Procedure: ATRIAL FIBRILLATION ABLATION;  Surgeon: Thompson Grayer, MD;  Location: [facility];  Service: Cardiovascular;  Laterality: N/A;   BUNIONECTOMY     implantable loop recorder placement  03/25/2020   Medtronic Reveal Linq model LNQ 22 (SN [id number]) implantable loop recorder implanted in office by Dr Jorja Loa JOINT SURGERY     TOTAL KNEE ARTHROPLASTY Right 06/17/2019   Procedure: TOTAL KNEE ARTHROPLASTY;  Surgeon: Vickey Huger, MD;  Location: [facility];  Service: Orthopedics;  Laterality: Right;     Family History  Problem Relation Age of Onset   Stroke Mother    Stroke Father    Breast cancer Sister      Social History   Socioeconomic History   Marital status: Married    Spouse name: Not on file   Number of children: Not on file   Years of education: Not on file   Highest education level: Not on file  Occupational History   Not on file  Tobacco Use   Smoking status: Former    Packs/day: 0.25  Years: 20.00    Total pack years: 5.00    Types: Cigarettes    Quit date: 2010    Years since quitting: 13.8   Smokeless tobacco: Never   Tobacco comments:    Social  Vaping Use   Vaping Use: Never used  Substance and Sexual Activity   Alcohol use: Yes    Alcohol/week: 4.0 - 5.0 standard drinks of alcohol    Types: 2 Shots of liquor, 2 - 3 Standard drinks or equivalent per week    Comment: "I drink too much alcohol",  20-25 drinks per week   Drug use: Never   Sexual activity: Not on file  Other Topics Concern   Not on file  Social History Narrative   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   Social Determinants of Health   Financial Resource Strain: Not on file  Food Insecurity: Not on file  Transportation Needs: Not on file  Physical Activity: Not on file  Stress: Not on file   Social Connections: Not on file  Intimate Partner Violence: Not on file     BP 118/70   Pulse 63   Ht '6\' 4"'$  (1.93 m)   Wt 228 lb 3.2 oz (103.5 kg)   SpO2 99%   BMI 27.78 kg/m   Physical Exam:  Well appearing 66 yo man, NAD HEENT: Unremarkable Neck:  No JVD, no thyromegally Lymphatics:  No adenopathy Back:  No CVA tenderness Lungs:  Clear with no wheezes HEART:  Regular rate rhythm, no murmurs, no rubs, no clicks Abd:  soft, positive bowel sounds, no organomegally, no rebound, no guarding Ext:  2 plus pulses, no edema, no cyanosis, no clubbing Skin:  No rashes no nodules Neuro:  CN II through XII intact, motor grossly intact  EKG - nsr with no STT changes.   Assess/Plan:  PAF - he appears to be maintaining NSR. We will hold off for now on systemic anti-coagulation. Dyspnea with exertion - he is concerned about worsening CAD. I have recommended he undergo a regular GXT. Additional testing to be determined by the results of the stress test.  Ronnie Caldwell

## 2022-09-15 NOTE — Addendum Note (Signed)
Addended by: Oleta Mouse C on: 09/15/2022 09:47 AM   Modules accepted: Orders

## 2022-09-15 NOTE — Addendum Note (Signed)
Addended by: Oleta Mouse C on: 09/15/2022 10:03 AM   Modules accepted: Orders

## 2022-09-16 ENCOUNTER — Encounter: Payer: Self-pay | Admitting: Internal Medicine

## 2022-09-16 DIAGNOSIS — Z79899 Other long term (current) drug therapy: Secondary | ICD-10-CM | POA: Diagnosis not present

## 2022-09-16 DIAGNOSIS — R209 Unspecified disturbances of skin sensation: Secondary | ICD-10-CM | POA: Diagnosis not present

## 2022-09-22 ENCOUNTER — Ambulatory Visit: Payer: Medicare Other | Attending: Internal Medicine

## 2022-09-22 DIAGNOSIS — I480 Paroxysmal atrial fibrillation: Secondary | ICD-10-CM

## 2022-09-22 DIAGNOSIS — I4892 Unspecified atrial flutter: Secondary | ICD-10-CM | POA: Diagnosis not present

## 2022-09-23 LAB — EXERCISE TOLERANCE TEST
Angina Index: 0
Duke Treadmill Score: 9
Estimated workload: 10.1
Exercise duration (min): 8 min
Exercise duration (sec): 34 s
MPHR: 152 {beats}/min
Peak HR: 155 {beats}/min
Percent HR: 101 %
RPE: 16
Rest HR: 72 {beats}/min
ST Depression (mm): 0 mm

## 2022-10-11 DIAGNOSIS — G4733 Obstructive sleep apnea (adult) (pediatric): Secondary | ICD-10-CM | POA: Diagnosis not present

## 2022-10-20 DIAGNOSIS — I2510 Atherosclerotic heart disease of native coronary artery without angina pectoris: Secondary | ICD-10-CM | POA: Diagnosis not present

## 2022-10-25 ENCOUNTER — Telehealth: Payer: Self-pay

## 2022-10-25 NOTE — Telephone Encounter (Signed)
-----   Message from Evans Lance, MD sent at 10/05/2022 10:24 AM EST ----- Negative stress test.

## 2022-10-25 NOTE — Telephone Encounter (Signed)
Called patient per Dr. Tanna Furry assessment of the GXT stress test.    Per Dr Lovena Le, Negative Stress Test results.  Pt stated he at times is mildly short of breath, but associates it to being "out of shape."  Pt told to reach out to [place] if he becomes more symptomatic, or symptoms worsen.  Pt understood.  Will message Dr. Lovena Le about follow up.

## 2022-10-27 DIAGNOSIS — L84 Corns and callosities: Secondary | ICD-10-CM | POA: Diagnosis not present

## 2022-10-27 DIAGNOSIS — I8393 Asymptomatic varicose veins of bilateral lower extremities: Secondary | ICD-10-CM | POA: Diagnosis not present

## 2022-10-27 DIAGNOSIS — D890 Polyclonal hypergammaglobulinemia: Secondary | ICD-10-CM | POA: Diagnosis not present

## 2022-10-27 DIAGNOSIS — N138 Other obstructive and reflux uropathy: Secondary | ICD-10-CM | POA: Diagnosis not present

## 2022-10-27 DIAGNOSIS — M25552 Pain in left hip: Secondary | ICD-10-CM | POA: Diagnosis not present

## 2022-10-27 DIAGNOSIS — G4733 Obstructive sleep apnea (adult) (pediatric): Secondary | ICD-10-CM | POA: Diagnosis not present

## 2022-10-27 DIAGNOSIS — G629 Polyneuropathy, unspecified: Secondary | ICD-10-CM | POA: Diagnosis not present

## 2022-10-27 DIAGNOSIS — I2510 Atherosclerotic heart disease of native coronary artery without angina pectoris: Secondary | ICD-10-CM | POA: Diagnosis not present

## 2022-10-27 DIAGNOSIS — Z0000 Encounter for general adult medical examination without abnormal findings: Secondary | ICD-10-CM | POA: Diagnosis not present

## 2022-10-27 DIAGNOSIS — Z23 Encounter for immunization: Secondary | ICD-10-CM | POA: Diagnosis not present

## 2022-10-28 NOTE — Telephone Encounter (Signed)
Start walking every day to get back into better shape.

## 2022-11-18 DIAGNOSIS — M2041 Other hammer toe(s) (acquired), right foot: Secondary | ICD-10-CM | POA: Diagnosis not present

## 2022-11-18 DIAGNOSIS — B079 Viral wart, unspecified: Secondary | ICD-10-CM | POA: Diagnosis not present

## 2022-11-18 DIAGNOSIS — M79671 Pain in right foot: Secondary | ICD-10-CM | POA: Diagnosis not present

## 2022-12-02 DIAGNOSIS — L03031 Cellulitis of right toe: Secondary | ICD-10-CM | POA: Diagnosis not present

## 2022-12-16 DIAGNOSIS — R3915 Urgency of urination: Secondary | ICD-10-CM | POA: Diagnosis not present

## 2022-12-16 DIAGNOSIS — R3121 Asymptomatic microscopic hematuria: Secondary | ICD-10-CM | POA: Diagnosis not present

## 2022-12-21 ENCOUNTER — Other Ambulatory Visit: Payer: Self-pay | Admitting: Urology

## 2022-12-21 DIAGNOSIS — R3129 Other microscopic hematuria: Secondary | ICD-10-CM

## 2023-02-27 ENCOUNTER — Encounter: Payer: Self-pay | Admitting: *Deleted

## 2023-03-27 DIAGNOSIS — M1611 Unilateral primary osteoarthritis, right hip: Secondary | ICD-10-CM | POA: Diagnosis not present

## 2023-03-28 ENCOUNTER — Encounter: Payer: Self-pay | Admitting: Internal Medicine

## 2023-03-30 ENCOUNTER — Telehealth: Payer: Self-pay

## 2023-03-30 NOTE — Telephone Encounter (Signed)
Pt has appt 05/01/23 with Francis Dowse, PAC. Will update all parties involved.

## 2023-03-30 NOTE — Telephone Encounter (Signed)
ANESTHESIA LISTED ON CLEARANCE FORM IS SPINAL

## 2023-03-30 NOTE — Telephone Encounter (Signed)
Primary Cardiologist:None  Chart reviewed as part of pre-operative protocol coverage. Because of Ronnie Caldwell's past medical history and time since last visit, he/she will require a follow-up visit in order to better assess preoperative cardiovascular risk.  Pre-op covering staff: -Patient has appointment with Francis Dowse, PA on 05/01/2023 at which time clearance will be addressed.  Appointment notes have been updated. - Please contact requesting surgeon's office via preferred method (i.e, phone, fax) to inform them of need for appointment prior to surgery.    Levi Aland, NP-C  03/30/2023, 3:57 PM [address], [address] Office [phone] Fax [phone]

## 2023-03-30 NOTE — Telephone Encounter (Signed)
   Pre-operative Risk Assessment    Patient Name: Ronnie Caldwell  DOB: 06-07-1956 MRN: [id number]      Request for Surgical Clearance    Procedure:  Right Total Hip Arthroplasty  Date of Surgery:  Clearance 06/15/23                                 Surgeon:  Dr Durene Romans Surgeon's Group or Practice Name:  [facility] Phone number:  [phone] Fax number:  [phone]   Type of Clearance Requested:   - Medical    Type of Anesthesia:  Spinal   Additional requests/questions:  N/A  SignedMelina Schools   03/30/2023, 1:52 PM

## 2023-04-29 NOTE — Progress Notes (Unsigned)
Cardiology Office Note Date:  05/01/2023  Patient ID:  Ronnie Caldwell, Ronnie Caldwell 12/25/1955, MRN [id number] PCP:  Merri Brunette, MD  Electrophysiologist: Dr. Ladona Ridgel      Chief Complaint:  pre-op  History of Present Illness: Ronnie Caldwell is a 67 y.o. male with history of CAD (non-obstructive by CTa), HLD, OSA w/CPAP, AFib  He saw Dr. Ladona Ridgel 09/13/22, recently some friends/family with heart problems, and had concerns of his own.  C/o progressive DOE, also had recently climbed/hiked [employer]. Planned for stress test  Stress test was negative and advised to continue to exercise increase conditioning  Pending hip surgery RCRI score is zero, 0.4% risk  TODAY Outside of his hip he is doing very well No longer actively doing loop transmissions No difficulties with ADLs, despite his hip, goes to the gym regularly with excellent exertional capacity He will infrequently when first in bed feel a fleeting/momentary CP or " a little something" in his chest, no CP, palpitations, SOB with exertional or at the gym No near syncope or syncope.    AFib/AAd hx Diagnosed 2021 PVI and CTI ablation 07/09/20 (Dr. Johney Frame) MDT LINQ II implanted 03/25/20  Past Medical History:  Diagnosis Date   Arthritis    BPH (benign prostatic hyperplasia)    Callus of foot    Chronic anxiety    Depression    GERD (gastroesophageal reflux disease)    Hx of fracture of clavicle    Hypercholesteremia    Paroxysmal atrial fibrillation (HCC)    PONV (postoperative nausea and vomiting)    30 years ago    Sleep apnea    uses CPAP   TMJ syndrome    Varicose vein of leg    Wenckebach second degree AV block     Past Surgical History:  Procedure Laterality Date   ATRIAL FIBRILLATION ABLATION N/A 07/09/2020   Procedure: ATRIAL FIBRILLATION ABLATION;  Surgeon: Hillis Range, MD;  Location: [facility];  Service: Cardiovascular;  Laterality: N/A;   BUNIONECTOMY     implantable loop recorder  placement  03/25/2020   Medtronic Reveal Linq model LNQ 22 (SN [id number]) implantable loop recorder implanted in office by Dr Jeanie Cooks JOINT SURGERY     TOTAL KNEE ARTHROPLASTY Right 06/17/2019   Procedure: TOTAL KNEE ARTHROPLASTY;  Surgeon: Dannielle Huh, MD;  Location: [facility];  Service: Orthopedics;  Laterality: Right;    Current Outpatient Medications  Medication Sig Dispense Refill   b complex vitamins tablet Take 1 tablet by mouth daily.     citalopram (CELEXA) 20 MG tablet Take 20 mg by mouth every morning.     milk thistle 175 MG tablet Take 175 mg by mouth daily.     Multiple Vitamin (MULTIVITAMIN WITH MINERALS) TABS tablet Take 1 tablet by mouth daily.     omeprazole (PRILOSEC) 40 MG capsule Take 40 mg by mouth daily.     rosuvastatin (CRESTOR) 10 MG tablet Take 10 mg by mouth daily.     tadalafil (CIALIS) 5 MG tablet Take 5 mg by mouth daily as needed for erectile dysfunction.      tamsulosin (FLOMAX) 0.4 MG CAPS capsule Take 0.4 mg by mouth daily.     traZODone (DESYREL) 50 MG tablet Take 50 mg by mouth at bedtime.     No current facility-administered medications for this visit.    Allergies:   Patient has no known allergies.   Social History:  The patient  reports that he  quit smoking about 14 years ago. His smoking use included cigarettes. He has a 5.00 pack-year smoking history. He has never used smokeless tobacco. He reports current alcohol use of about 4.0 - 5.0 standard drinks of alcohol per week. He reports that he does not use drugs.   Family History:  The patient's family history includes Breast cancer in his sister; Stroke in his father and mother.  ROS:  Please see the history of present illness.    All other systems are reviewed and otherwise negative.   PHYSICAL EXAM:  VS:  BP 108/72   Pulse 61   Ht 6' 4.5" (1.943 m)   Wt 223 lb 6.4 oz (101.3 kg)   SpO2 96%   BMI 26.84 kg/m  BMI: Body mass index is 26.84 kg/m. Well nourished, well  developed, in no acute distress HEENT: normocephalic, atraumatic Neck: no JVD, carotid bruits or masses Cardiac:  RRR; no significant murmurs, no rubs, or gallops Lungs:  CTA b/l, no wheezing, rhonchi or rales Abd: soft, nontender MS: no deformity or atrophy Ext: no edema  large varicose veins, L>R Skin: warm and dry, no rash Neuro:  No gross deficits appreciated Psych: euthymic mood, full affect  ILR site is stable, no tethering or discomfort   EKG:  Done today and reviewed by myself shows  SR 61bpm, no ischemic changes, appears unchanged from prior  Device interrogation done today and reviewed by myself:  Battery is good No episodes/arrhythmias   09/22/22: EST   No ST deviation was noted.   Normal ETT    07/09/20: EPS/ablation CONCLUSIONS: 1. Sinus rhythm upon presentation.   2. Intracardiac echo reveals a moderate sized left atrium with four separate pulmonary veins without evidence of pulmonary vein stenosis. 3. Successful electrical isolation and anatomical encircling of all four pulmonary veins with radiofrequency current.    4. Cavo-tricuspid isthmus ablation was performed with complete bidirectional isthmus block achieved.  5. No inducible arrhythmias following ablation both on and off of Isuprel 6. No early apparent complications.    07/03/20 Cardiac CT IMPRESSION: 1. There is normal pulmonary vein drainage into the left atrium. 2. The left atrial appendage is large, curved with chicken wing morphology and 1 major lobe. Ostial size 33 x 19 mm and length 37 mm. There is no thrombus in the left atrial appendage  3. The esophagus runs in the left atrial midline and is not in the proximity to any of the pulmonary veins. 4. Coronary calcium score of 250. This was 36 percentile for age and sex matched control. Normal coronary origin with right dominance. There is mild diffuse calcified plaque in the proximal LAD and RCA with stenosis 25-49%, aggressive risk factor  modification is recommended.   12/24/19: TTE 1. Left ventricular ejection fraction, by estimation, is 55 to 60%. The  left ventricle has normal function. The left ventrical has no regional  wall motion abnormalities. The left ventricular internal cavity size was  moderately dilated. Left ventricular  diastolic parameters are indeterminate.   2. Consider CT aorta to measure aorta.   3. Right ventricular systolic function is normal. The right ventricular  size is mildly enlarged. There is normal pulmonary artery systolic  pressure.   4. Right atrial size was mildly dilated.   5. Trivial mitral valve regurgitation.   6. The aortic valve is normal in structure and function. Aortic valve  regurgitation is not visualized. No aortic stenosis is present.   7. 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   8. The inferior vena cava is normal in size with greater than 50%  respiratory variability, suggesting right atrial pressure of 3 mmHg.    Recent Labs: No results found for requested labs within last 365 days.  No results found for requested labs within last 365 days.   CrCl cannot be calculated (Patient's most recent lab result is older than the maximum 21 days allowed.).   Wt Readings from Last 3 Encounters:  05/01/23 223 lb 6.4 oz (101.3 kg)  09/13/22 228 lb 3.2 oz (103.5 kg)  06/27/22 220 lb (99.8 kg)     Other studies reviewed: Additional studies/records reviewed today include: summarized above  ASSESSMENT AND PLAN:  Paroxysmal Afib CHA2DS2Vasc is one, off a/c s/p PVI ablation Zero % burden  2. Pre-op Low cardiac risk surgery Low cardiac risk score, discussed with the patient No cardiac contraindication to hip surgery  Disposition: F/u with [place] annually as he has been, sooner if needed   Current medicines are reviewed at length with the patient today.  The patient did not have any concerns regarding medicines.  Norma Fredrickson,  PA-C 05/01/2023 9:55 AM     [facility] [address] [place] [address] [phone] (office)  [phone] (fax)

## 2023-05-01 ENCOUNTER — Ambulatory Visit: Payer: Medicare Other | Attending: Physician Assistant | Admitting: Physician Assistant

## 2023-05-01 ENCOUNTER — Encounter: Payer: Self-pay | Admitting: Physician Assistant

## 2023-05-01 VITALS — BP 108/72 | HR 61 | Ht 76.5 in | Wt 223.4 lb

## 2023-05-01 DIAGNOSIS — I480 Paroxysmal atrial fibrillation: Secondary | ICD-10-CM | POA: Diagnosis not present

## 2023-05-01 DIAGNOSIS — Z01818 Encounter for other preprocedural examination: Secondary | ICD-10-CM | POA: Diagnosis not present

## 2023-05-01 DIAGNOSIS — Z4509 Encounter for adjustment and management of other cardiac device: Secondary | ICD-10-CM

## 2023-05-01 LAB — CUP PACEART INCLINIC DEVICE CHECK
Date Time Interrogation Session: 20240617100317
Implantable Pulse Generator Implant Date: 20210512

## 2023-05-01 NOTE — Patient Instructions (Signed)
Medication Instructions:   Your physician recommends that you continue on your current medications as directed. Please refer to the Current Medication list given to you today.  *If you need a refill on your cardiac medications before your next appointment, please call your pharmacy*   Lab Work:  NONE ORDERED  TODAY    If you have labs (blood work) drawn today and your tests are completely normal, you will receive your results only by: MyChart Message (if you have MyChart) OR A paper copy in the mail If you have any lab test that is abnormal or we need to change your treatment, we will call you to review the results.   Testing/Procedures: NONE ORDERED  TODAY      Follow-Up: At [facility], you and your health needs are our priority.  As part of our continuing mission to provide you with exceptional heart care, we have created designated Provider Care Teams.  These Care Teams include your primary Cardiologist (physician) and Advanced Practice Providers (APPs -  Physician Assistants and Nurse Practitioners) who all work together to provide you with the care you need, when you need it.  We recommend signing up for the patient portal called "MyChart".  Sign up information is provided on this After Visit Summary.  MyChart is used to connect with patients for Virtual Visits (Telemedicine).  Patients are able to view lab/test results, encounter notes, upcoming appointments, etc.  Non-urgent messages can be sent to your provider as well.   To learn more about what you can do with MyChart, go to [email or web address].    Your next appointment:   1 year(s)  Provider:   Gregg Taylor, MD    Other Instructions  

## 2023-05-02 DIAGNOSIS — Z8679 Personal history of other diseases of the circulatory system: Secondary | ICD-10-CM | POA: Diagnosis not present

## 2023-05-02 DIAGNOSIS — I2510 Atherosclerotic heart disease of native coronary artery without angina pectoris: Secondary | ICD-10-CM | POA: Diagnosis not present

## 2023-05-02 DIAGNOSIS — Z01818 Encounter for other preprocedural examination: Secondary | ICD-10-CM | POA: Diagnosis not present

## 2023-05-02 DIAGNOSIS — Z9889 Other specified postprocedural states: Secondary | ICD-10-CM | POA: Diagnosis not present

## 2023-05-10 NOTE — Telephone Encounter (Signed)
Our office received a duplicate request. Pt was recently seen by Francis Dowse, [facility] 05/01/23. I have reviewed the ov notes and PAC has given clearance for pt ok to proceed with surgery. I will fax notes to requesting office again today, as notes were faxed on 05/01/23.   Per Francis Dowse, PAC:  Pre-op Low cardiac risk surgery Low cardiac risk score, discussed with the patient No cardiac contraindication to hip surgery

## 2023-05-29 DIAGNOSIS — Z79899 Other long term (current) drug therapy: Secondary | ICD-10-CM | POA: Diagnosis not present

## 2023-06-19 DIAGNOSIS — M1611 Unilateral primary osteoarthritis, right hip: Secondary | ICD-10-CM | POA: Diagnosis not present

## 2023-06-19 DIAGNOSIS — M25751 Osteophyte, right hip: Secondary | ICD-10-CM | POA: Diagnosis not present

## 2023-07-24 DIAGNOSIS — Z4731 Aftercare following explantation of shoulder joint prosthesis: Secondary | ICD-10-CM | POA: Diagnosis not present

## 2023-07-27 DIAGNOSIS — M13841 Other specified arthritis, right hand: Secondary | ICD-10-CM | POA: Diagnosis not present

## 2023-11-16 DIAGNOSIS — R319 Hematuria, unspecified: Secondary | ICD-10-CM | POA: Diagnosis not present

## 2023-11-16 DIAGNOSIS — R3915 Urgency of urination: Secondary | ICD-10-CM | POA: Diagnosis not present

## 2023-11-16 DIAGNOSIS — R3121 Asymptomatic microscopic hematuria: Secondary | ICD-10-CM | POA: Diagnosis not present

## 2023-11-22 ENCOUNTER — Other Ambulatory Visit: Payer: Self-pay | Admitting: Urology

## 2023-11-22 DIAGNOSIS — R972 Elevated prostate specific antigen [PSA]: Secondary | ICD-10-CM

## 2023-11-24 ENCOUNTER — Other Ambulatory Visit (HOSPITAL_COMMUNITY): Payer: Self-pay | Admitting: Urology

## 2023-11-24 DIAGNOSIS — R972 Elevated prostate specific antigen [PSA]: Secondary | ICD-10-CM

## 2023-11-24 DIAGNOSIS — R319 Hematuria, unspecified: Secondary | ICD-10-CM

## 2023-12-06 ENCOUNTER — Ambulatory Visit (HOSPITAL_COMMUNITY)
Admission: RE | Admit: 2023-12-06 | Discharge: 2023-12-06 | Disposition: A | Discharge status: Home / Self Care | Payer: Medicare Other | Source: Ambulatory Visit | Attending: Urology | Admitting: Urology

## 2023-12-06 DIAGNOSIS — R319 Hematuria, unspecified: Secondary | ICD-10-CM | POA: Diagnosis not present

## 2023-12-06 DIAGNOSIS — N3289 Other specified disorders of bladder: Secondary | ICD-10-CM | POA: Diagnosis not present

## 2023-12-06 DIAGNOSIS — N281 Cyst of kidney, acquired: Secondary | ICD-10-CM | POA: Diagnosis not present

## 2023-12-06 MED ORDER — IOHEXOL 300 MG/ML  SOLN
100.0000 mL | Freq: Once | INTRAMUSCULAR | Status: AC | PRN
  Administered 2023-12-06: 100 mL via INTRAVENOUS

## 2023-12-20 DIAGNOSIS — D509 Iron deficiency anemia, unspecified: Secondary | ICD-10-CM | POA: Diagnosis not present

## 2023-12-20 DIAGNOSIS — R748 Abnormal levels of other serum enzymes: Secondary | ICD-10-CM | POA: Diagnosis not present

## 2023-12-20 DIAGNOSIS — I2510 Atherosclerotic heart disease of native coronary artery without angina pectoris: Secondary | ICD-10-CM | POA: Diagnosis not present

## 2023-12-21 LAB — LAB REPORT - SCANNED
Calcium: 10.2
EGFR: 79

## 2023-12-27 DIAGNOSIS — Z23 Encounter for immunization: Secondary | ICD-10-CM | POA: Diagnosis not present

## 2023-12-27 DIAGNOSIS — G4733 Obstructive sleep apnea (adult) (pediatric): Secondary | ICD-10-CM | POA: Diagnosis not present

## 2023-12-27 DIAGNOSIS — Z9889 Other specified postprocedural states: Secondary | ICD-10-CM | POA: Diagnosis not present

## 2023-12-27 DIAGNOSIS — I480 Paroxysmal atrial fibrillation: Secondary | ICD-10-CM | POA: Diagnosis not present

## 2023-12-27 DIAGNOSIS — Z0000 Encounter for general adult medical examination without abnormal findings: Secondary | ICD-10-CM | POA: Diagnosis not present

## 2023-12-27 DIAGNOSIS — D1722 Benign lipomatous neoplasm of skin and subcutaneous tissue of left arm: Secondary | ICD-10-CM | POA: Diagnosis not present

## 2023-12-27 DIAGNOSIS — E875 Hyperkalemia: Secondary | ICD-10-CM | POA: Diagnosis not present

## 2023-12-27 DIAGNOSIS — I2510 Atherosclerotic heart disease of native coronary artery without angina pectoris: Secondary | ICD-10-CM | POA: Diagnosis not present

## 2023-12-27 DIAGNOSIS — R3129 Other microscopic hematuria: Secondary | ICD-10-CM | POA: Diagnosis not present

## 2023-12-27 DIAGNOSIS — Z79899 Other long term (current) drug therapy: Secondary | ICD-10-CM | POA: Diagnosis not present

## 2023-12-27 DIAGNOSIS — B351 Tinea unguium: Secondary | ICD-10-CM | POA: Diagnosis not present

## 2024-01-05 ENCOUNTER — Other Ambulatory Visit: Payer: Medicare Other

## 2024-01-10 DIAGNOSIS — Z79899 Other long term (current) drug therapy: Secondary | ICD-10-CM | POA: Diagnosis not present

## 2024-01-23 DIAGNOSIS — L720 Epidermal cyst: Secondary | ICD-10-CM | POA: Diagnosis not present

## 2024-02-07 DIAGNOSIS — Z79899 Other long term (current) drug therapy: Secondary | ICD-10-CM | POA: Diagnosis not present

## 2024-02-27 DIAGNOSIS — L239 Allergic contact dermatitis, unspecified cause: Secondary | ICD-10-CM | POA: Diagnosis not present

## 2024-03-11 ENCOUNTER — Other Ambulatory Visit: Payer: Medicare Other
